# Patient Record
Sex: Male | Born: 2000 | Race: Black or African American | Hispanic: No | Marital: Single | Smoking: Never smoker
Health system: Southern US, Community
[De-identification: ages and names within clinical notes are randomized; demographics above are authoritative.]

## PROBLEM LIST (undated history)

## (undated) DIAGNOSIS — D571 Sickle-cell disease without crisis: Secondary | ICD-10-CM

## (undated) DIAGNOSIS — J302 Other seasonal allergic rhinitis: Secondary | ICD-10-CM

## (undated) DIAGNOSIS — D5701 Hb-SS disease with acute chest syndrome: Secondary | ICD-10-CM

## (undated) HISTORY — PX: OTHER SURGICAL HISTORY: SHX169

## (undated) HISTORY — PX: TONSILLECTOMY AND ADENOIDECTOMY: SUR1326

## (undated) HISTORY — PX: CHOLECYSTECTOMY: SHX55

---
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
## 2018-11-16 ENCOUNTER — Emergency Department (HOSPITAL_COMMUNITY): Payer: Medicaid Other

## 2018-11-16 ENCOUNTER — Other Ambulatory Visit: Payer: Self-pay

## 2018-11-16 ENCOUNTER — Encounter (HOSPITAL_COMMUNITY): Payer: Self-pay

## 2018-11-16 ENCOUNTER — Inpatient Hospital Stay (HOSPITAL_COMMUNITY)
Admission: EM | Admit: 2018-11-16 | Discharge: 2018-11-20 | DRG: 193 | Disposition: A | Discharge status: Home / Self Care | Payer: Medicaid Other | Attending: Internal Medicine | Admitting: Internal Medicine

## 2018-11-16 DIAGNOSIS — D649 Anemia, unspecified: Secondary | ICD-10-CM | POA: Diagnosis not present

## 2018-11-16 DIAGNOSIS — R6510 Systemic inflammatory response syndrome (SIRS) of non-infectious origin without acute organ dysfunction: Secondary | ICD-10-CM | POA: Diagnosis present

## 2018-11-16 DIAGNOSIS — Z8249 Family history of ischemic heart disease and other diseases of the circulatory system: Secondary | ICD-10-CM

## 2018-11-16 DIAGNOSIS — D638 Anemia in other chronic diseases classified elsewhere: Secondary | ICD-10-CM | POA: Diagnosis present

## 2018-11-16 DIAGNOSIS — J9601 Acute respiratory failure with hypoxia: Secondary | ICD-10-CM | POA: Diagnosis present

## 2018-11-16 DIAGNOSIS — Z881 Allergy status to other antibiotic agents status: Secondary | ICD-10-CM

## 2018-11-16 DIAGNOSIS — D5701 Hb-SS disease with acute chest syndrome: Secondary | ICD-10-CM | POA: Diagnosis present

## 2018-11-16 DIAGNOSIS — Z79899 Other long term (current) drug therapy: Secondary | ICD-10-CM | POA: Diagnosis not present

## 2018-11-16 DIAGNOSIS — Z832 Family history of diseases of the blood and blood-forming organs and certain disorders involving the immune mechanism: Secondary | ICD-10-CM

## 2018-11-16 DIAGNOSIS — R509 Fever, unspecified: Secondary | ICD-10-CM

## 2018-11-16 DIAGNOSIS — J189 Pneumonia, unspecified organism: Principal | ICD-10-CM | POA: Diagnosis present

## 2018-11-16 DIAGNOSIS — Z833 Family history of diabetes mellitus: Secondary | ICD-10-CM | POA: Diagnosis not present

## 2018-11-16 DIAGNOSIS — D5700 Hb-SS disease with crisis, unspecified: Secondary | ICD-10-CM

## 2018-11-16 DIAGNOSIS — R05 Cough: Secondary | ICD-10-CM

## 2018-11-16 DIAGNOSIS — D571 Sickle-cell disease without crisis: Secondary | ICD-10-CM | POA: Diagnosis present

## 2018-11-16 DIAGNOSIS — Z823 Family history of stroke: Secondary | ICD-10-CM | POA: Diagnosis not present

## 2018-11-16 DIAGNOSIS — R5081 Fever presenting with conditions classified elsewhere: Secondary | ICD-10-CM | POA: Diagnosis present

## 2018-11-16 DIAGNOSIS — J181 Lobar pneumonia, unspecified organism: Secondary | ICD-10-CM | POA: Diagnosis not present

## 2018-11-16 DIAGNOSIS — R059 Cough, unspecified: Secondary | ICD-10-CM

## 2018-11-16 LAB — RETICULOCYTES
Immature Retic Fract: 34.2 % — ABNORMAL HIGH (ref 2.3–15.9)
RBC.: 2.2 MIL/uL — AB (ref 4.22–5.81)
RETIC CT PCT: 11.6 % — AB (ref 0.4–3.1)
Retic Count, Absolute: 334.2 10*3/uL — ABNORMAL HIGH (ref 19.0–186.0)

## 2018-11-16 LAB — CBC WITH DIFFERENTIAL/PLATELET
Abs Immature Granulocytes: 0.18 10*3/uL — ABNORMAL HIGH (ref 0.00–0.07)
Basophils Absolute: 0.1 10*3/uL (ref 0.0–0.1)
Basophils Relative: 0 %
EOS ABS: 0 10*3/uL (ref 0.0–0.5)
EOS PCT: 0 %
HEMATOCRIT: 19.1 % — AB (ref 39.0–52.0)
Hemoglobin: 6.6 g/dL — CL (ref 13.0–17.0)
Immature Granulocytes: 1 %
LYMPHS ABS: 3.4 10*3/uL (ref 0.7–4.0)
Lymphocytes Relative: 17 %
MCH: 30 pg (ref 26.0–34.0)
MCHC: 34.6 g/dL (ref 30.0–36.0)
MCV: 86.8 fL (ref 80.0–100.0)
Monocytes Absolute: 1.4 10*3/uL — ABNORMAL HIGH (ref 0.1–1.0)
Monocytes Relative: 7 %
Neutro Abs: 14.5 10*3/uL — ABNORMAL HIGH (ref 1.7–7.7)
Neutrophils Relative %: 75 %
Platelets: 435 10*3/uL — ABNORMAL HIGH (ref 150–400)
RBC: 2.2 MIL/uL — ABNORMAL LOW (ref 4.22–5.81)
RDW: 24.5 % — AB (ref 11.5–15.5)
WBC: 19.5 10*3/uL — ABNORMAL HIGH (ref 4.0–10.5)
nRBC: 0.6 % — ABNORMAL HIGH (ref 0.0–0.2)

## 2018-11-16 LAB — COMPREHENSIVE METABOLIC PANEL
ALT: 11 U/L (ref 0–44)
ANION GAP: 10 (ref 5–15)
AST: 34 U/L (ref 15–41)
Albumin: 3.9 g/dL (ref 3.5–5.0)
Alkaline Phosphatase: 42 U/L (ref 38–126)
BUN: 8 mg/dL (ref 6–20)
CHLORIDE: 103 mmol/L (ref 98–111)
CO2: 25 mmol/L (ref 22–32)
CREATININE: 0.55 mg/dL — AB (ref 0.61–1.24)
Calcium: 8.4 mg/dL — ABNORMAL LOW (ref 8.9–10.3)
Glucose, Bld: 93 mg/dL (ref 70–99)
Potassium: 3.2 mmol/L — ABNORMAL LOW (ref 3.5–5.1)
Sodium: 138 mmol/L (ref 135–145)
Total Bilirubin: 11.8 mg/dL — ABNORMAL HIGH (ref 0.3–1.2)
Total Protein: 7.9 g/dL (ref 6.5–8.1)

## 2018-11-16 LAB — LACTIC ACID, PLASMA: LACTIC ACID, VENOUS: 1 mmol/L (ref 0.5–1.9)

## 2018-11-16 LAB — PREPARE RBC (CROSSMATCH)

## 2018-11-16 LAB — ABO/RH: ABO/RH(D): A POS

## 2018-11-16 MED ORDER — FOLIC ACID 1 MG PO TABS
1.0000 mg | ORAL_TABLET | Freq: Every day | ORAL | Status: DC
  Administered 2018-11-16 – 2018-11-20 (×5): 1 mg via ORAL
  Filled 2018-11-16 (×5): qty 1

## 2018-11-16 MED ORDER — HYDROXYUREA 500 MG PO CAPS
1500.0000 mg | ORAL_CAPSULE | Freq: Every day | ORAL | Status: DC
  Administered 2018-11-16 – 2018-11-18 (×3): 1500 mg via ORAL
  Filled 2018-11-16 (×3): qty 3

## 2018-11-16 MED ORDER — SODIUM CHLORIDE 0.9 % IV SOLN
INTRAVENOUS | Status: DC | PRN
  Administered 2018-11-16: 500 mL via INTRAVENOUS

## 2018-11-16 MED ORDER — ACETAMINOPHEN 325 MG PO TABS
650.0000 mg | ORAL_TABLET | Freq: Once | ORAL | Status: AC | PRN
  Administered 2018-11-16: 650 mg via ORAL
  Filled 2018-11-16: qty 2

## 2018-11-16 MED ORDER — SODIUM CHLORIDE 0.9% IV SOLUTION
Freq: Once | INTRAVENOUS | Status: AC
  Administered 2018-11-16: 22:00:00 via INTRAVENOUS

## 2018-11-16 MED ORDER — IBUPROFEN 200 MG PO TABS
600.0000 mg | ORAL_TABLET | Freq: Three times a day (TID) | ORAL | Status: DC | PRN
  Administered 2018-11-17 (×2): 600 mg via ORAL
  Filled 2018-11-16 (×3): qty 3

## 2018-11-16 MED ORDER — OXYCODONE-ACETAMINOPHEN 5-325 MG PO TABS: 1.0000 | ORAL_TABLET | Freq: Three times a day (TID) | ORAL | Status: DC | PRN

## 2018-11-16 MED ORDER — SODIUM CHLORIDE 0.9 % IV BOLUS (SEPSIS)
1000.0000 mL | Freq: Once | INTRAVENOUS | Status: AC
  Administered 2018-11-16: 1000 mL via INTRAVENOUS

## 2018-11-16 MED ORDER — LISINOPRIL 2.5 MG PO TABS
2.5000 mg | ORAL_TABLET | Freq: Every day | ORAL | Status: DC
  Administered 2018-11-17 – 2018-11-20 (×3): 2.5 mg via ORAL
  Filled 2018-11-16 (×5): qty 1

## 2018-11-16 MED ORDER — SODIUM CHLORIDE 0.9 % IV SOLN
INTRAVENOUS | Status: DC
  Administered 2018-11-16 – 2018-11-17 (×2): via INTRAVENOUS

## 2018-11-16 MED ORDER — ACETAMINOPHEN 325 MG PO TABS
650.0000 mg | ORAL_TABLET | Freq: Four times a day (QID) | ORAL | Status: DC | PRN
  Administered 2018-11-16 – 2018-11-20 (×4): 650 mg via ORAL
  Filled 2018-11-16 (×5): qty 2

## 2018-11-16 MED ORDER — OXYCODONE-ACETAMINOPHEN 10-325 MG PO TABS: 1.0000 | ORAL_TABLET | Freq: Three times a day (TID) | ORAL | Status: DC | PRN

## 2018-11-16 MED ORDER — CEFTRIAXONE SODIUM 1 G IJ SOLR
1.0000 g | Freq: Once | INTRAMUSCULAR | Status: AC
  Administered 2018-11-16: 1 g via INTRAVENOUS
  Filled 2018-11-16: qty 10

## 2018-11-16 MED ORDER — DOXYCYCLINE HYCLATE 100 MG PO TABS
100.0000 mg | ORAL_TABLET | Freq: Once | ORAL | Status: AC
  Administered 2018-11-16: 100 mg via ORAL
  Filled 2018-11-16: qty 1

## 2018-11-16 MED ORDER — SODIUM CHLORIDE 0.9 % IV SOLN
1.0000 g | INTRAVENOUS | Status: DC
  Administered 2018-11-17 – 2018-11-19 (×3): 1 g via INTRAVENOUS
  Filled 2018-11-16 (×3): qty 1

## 2018-11-16 MED ORDER — DOXYCYCLINE HYCLATE 100 MG PO TABS
100.0000 mg | ORAL_TABLET | Freq: Two times a day (BID) | ORAL | Status: DC
  Administered 2018-11-17 – 2018-11-20 (×7): 100 mg via ORAL
  Filled 2018-11-16 (×7): qty 1

## 2018-11-16 MED ORDER — HYDROXYUREA 500 MG PO CAPS: 1000.0000 mg | ORAL_CAPSULE | Freq: Every day | ORAL | Status: DC

## 2018-11-16 MED ORDER — OXYCODONE HCL 5 MG PO TABS: 5.0000 mg | ORAL_TABLET | Freq: Three times a day (TID) | ORAL | Status: DC | PRN

## 2018-11-16 NOTE — ED Notes (Signed)
ED TO INPATIENT HANDOFF REPORT  Name/Age/Gender William Gallegos 18 y.o. male  Code Status    Code Status Orders  (From admission, onward)         Start     Ordered   11/16/18 1929  Full code  Continuous     11/16/18 1929        Code Status History    Date Active Date Inactive Code Status Order ID Comments User Context   11/11/2017 2341 11/13/2017 1736 Full Code [id number]  Rometta EmeryGarba, Mohammad L, MD ED   05/09/2017 0026 05/10/2017 1442 Full Code [id number]  Briscoe Deutscherpyd, Timothy S, MD ED   06/13/2015 0527 06/15/2015 2121 Full Code [id number]  Ovid Curdkonye, Jeffrey, MD Inpatient      Home/SNF/Other Home  Chief Complaint possible pneumonia  Level of Care/Admitting Diagnosis ED Disposition    ED Disposition Condition Comment   Admit  [facility]ospital [facility] [[id number]]  Level of Care: Telemetry [5]  Admit to tele based on following criteria: Complex arrhythmia (Bradycardia/Tachycardia)  Diagnosis: PNA (pneumonia) [[id number]][[id number]]  Admitting Physician: Haydee MonicaAVID, RACHAL A [[id number]]  Attending Physician: Tarry KosAVID, RACHAL A [[id number]]  Estimated length of stay: 3 - 4 days  Certification:: I certify this patient will need inpatient services for at least 2 midnights  PT Class (Do Not Modify): Inpatient [101]  PT Acc Code (Do Not Modify): Private [1]       Medical History Past Medical History:  Diagnosis Date  . Acute chest syndrome (HCC)   . Seasonal allergies   . Sickle cell anemia (HCC)     Allergies Allergies  Allergen Reactions  . 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 Peanuts [Peanut Oil] Anaphylaxis and Swelling    Swelling of face   . Erythromycin Hives  . Other Swelling    Raisins--- facial swelling Mushrooms---face swelling    IV Location/Drains/Wounds Patient Lines/Drains/Airways Status   Active Line/Drains/Airways    Name:   Placement date:   Placement time:   Site:   Days:   Peripheral IV 11/16/18 Right;Anterior Forearm   11/16/18     1722    Forearm   less than 1          Labs/Imaging Results for orders placed or performed during the hospital encounter of 11/16/18 (from the past 48 hour(s))  Comprehensive metabolic panel     Status: Abnormal   Collection Time: 11/16/18  5:14 PM  Result Value Ref Range   Sodium 138 135 - 145 mmol/L   Potassium 3.2 (L) 3.5 - 5.1 mmol/L   Chloride 103 98 - 111 mmol/L   CO2 25 22 - 32 mmol/L   Glucose, Bld 93 70 - 99 mg/dL   BUN 8 6 - 20 mg/dL   Creatinine, Ser 4.690.55 (L) 0.61 - 1.24 mg/dL   Calcium 8.4 (L) 8.9 - 10.3 mg/dL   Total Protein 7.9 6.5 - 8.1 g/dL   Albumin 3.9 3.5 - 5.0 g/dL   AST 34 15 - 41 U/L   ALT 11 0 - 44 U/L   Alkaline Phosphatase 42 38 - 126 U/L   Total Bilirubin 11.8 (H) 0.3 - 1.2 mg/dL   GFR calc non Af Amer >60 >60 mL/min   GFR calc Af Amer >60 >60 mL/min   Anion gap 10 5 - 15    Comment: Performed at [facility], [address] [place], [place] [address]  CBC with Differential  Status: Abnormal   Collection Time: 11/16/18  5:14 PM  Result Value Ref Range   WBC 19.5 (H) 4.0 - 10.5 K/uL   RBC 2.20 (L) 4.22 - 5.81 MIL/uL   Hemoglobin 6.6 (LL) 13.0 - 17.0 g/dL    Comment: This critical result has verified and been called to [facility] by Zetta Bills on 02 15 2020 at 1833, and has been read back. CRITICAL VERIFIED   HCT 19.1 (L) 39.0 - 52.0 %   MCV 86.8 80.0 - 100.0 fL   MCH 30.0 26.0 - 34.0 pg   MCHC 34.6 30.0 - 36.0 g/dL   RDW 41.9 (H) 62.2 - 29.7 %   Platelets 435 (H) 150 - 400 K/uL   nRBC 0.6 (H) 0.0 - 0.2 %   Neutrophils Relative % 75 %   Neutro Abs 14.5 (H) 1.7 - 7.7 K/uL   Lymphocytes Relative 17 %   Lymphs Abs 3.4 0.7 - 4.0 K/uL   Monocytes Relative 7 %   Monocytes Absolute 1.4 (H) 0.1 - 1.0 K/uL   Eosinophils Relative 0 %   Eosinophils Absolute 0.0 0.0 - 0.5 K/uL   Basophils Relative 0 %   Basophils Absolute 0.1 0.0 - 0.1 K/uL   RBC Morphology RARE NUCLEATED RBC NOTED    Immature Granulocytes 1 %   Abs Immature  Granulocytes 0.18 (H) 0.00 - 0.07 K/uL   Polychromasia PRESENT    Sickle Cells PRESENT    Target Cells PRESENT     Comment: Performed at [facility], [address] [place], [place] [address]  Lactic acid, plasma     Status: None   Collection Time: 11/16/18  5:14 PM  Result Value Ref Range   Lactic Acid, Venous 1.0 0.5 - 1.9 mmol/L    Comment: Performed at [facility], [address], [place], [place] [address]  Reticulocytes     Status: Abnormal   Collection Time: 11/16/18  5:14 PM  Result Value Ref Range   Retic Ct Pct 11.6 (H) 0.4 - 3.1 %    Comment: MANUAL DILUTION   RBC. 2.20 (L) 4.22 - 5.81 MIL/uL   Retic Count, Absolute 334.2 (H) 19.0 - 186.0 K/uL   Immature Retic Fract 34.2 (H) 2.3 - 15.9 %    Comment: Performed at [facility], [address] [place], [place] [address]   Dg Chest 2 View  Result Date: 11/16/2018 CLINICAL DATA:  Nonproductive cough for 5 days, hard to take a deep breath, 86% oxygen saturation on room air, history sickle cell anemia EXAM: CHEST - 2 VIEW COMPARISON:  None FINDINGS: Enlargement of cardiac silhouette with pulmonary vascular congestion consistent with sickle cell disease. Mediastinal contours normal. LEFT lower lobe consolidation consistent with pneumonia. Remaining lungs clear. No pleural effusion or pneumothorax. IMPRESSION: Enlargement of cardiac silhouette with pulmonary vascular congestion consistent with history of sickle cell disease. LEFT lower lobe pneumonia. Electronically Signed   By: Ulyses Southward M.D.   On: 11/16/2018 16:39   None  Pending Labs Unresulted Labs (From admission, onward)    Start     Ordered   11/17/18 0500  Basic metabolic panel  Tomorrow morning,   R     11/16/18 1929   11/17/18 0500  CBC WITH DIFFERENTIAL  Tomorrow morning,   R     11/16/18 1929   11/16/18 1928  Culture, blood (routine x 2) Call MD if unable to obtain prior to antibiotics being given  BLOOD  CULTURE  X 2,   R    Comments:  If blood cultures drawn in Emergency Department - Do not draw and cancel order    11/16/18 1929   11/16/18 1928  Culture, sputum-assessment  Once,   R     11/16/18 1929   11/16/18 1928  Gram stain  Once,   R     11/16/18 1929   11/16/18 1928  Strep pneumoniae urinary antigen  Once,   R     11/16/18 1929   11/16/18 1852  Type and screen [facility]  Once,   R    Comments:  [facility]    11/16/18 1855   11/16/18 1852  Prepare RBC  (Adult Blood Administration - Red Blood Cells)  Once,   R    Question Answer Comment  # of Units 1 unit   Transfusion Indications Sickle cell crisis   Special Requirements Sickle Cell Protocol   If emergent release call blood bank Not emergent release   Instructions: Transfuse      11/16/18 1855   11/16/18 1633  Urinalysis, Routine w reflex microscopic  ONCE - STAT,   STAT     11/16/18 1632   11/16/18 1633  Urine culture  ONCE - STAT,   STAT     11/16/18 1632   11/16/18 1632  Culture, blood (routine x 2)  BLOOD CULTURE X 2,   STAT     11/16/18 1631   11/16/18 1632  Respiratory Panel by PCR  (Respiratory virus panel with precautions)  Once,   R     11/16/18 1631          Vitals/Pain Today's Vitals   11/16/18 1830 11/16/18 1900 11/16/18 1950 11/16/18 1950  BP: 117/69 111/63  110/66  Pulse: 83 88  90  Resp: (!) 22 (!) 25  (!) 26  Temp:    99.3 F (37.4 C)  TempSrc:    Oral  SpO2: 99% 99%  100%  Weight:      Height:      PainSc:   0-No pain     Isolation Precautions Droplet precaution  Medications Medications  0.9 %  sodium chloride infusion (Manually program via Guardrails IV Fluids) (has no administration in time range)  hydroxyurea (HYDREA) capsule 1,000 mg (has no administration in time range)  folic acid (FOLVITE) tablet 1 mg (has no administration in time range)  ibuprofen (ADVIL,MOTRIN) tablet 600 mg (has no administration in time range)  lisinopril  (PRINIVIL,ZESTRIL) tablet 2.5 mg (has no administration in time range)  oxyCODONE-acetaminophen (PERCOCET) 10-325 MG per tablet 1 tablet (has no administration in time range)  0.9 %  sodium chloride infusion (has no administration in time range)  cefTRIAXone (ROCEPHIN) 1 g in sodium chloride 0.9 % 100 mL IVPB (has no administration in time range)  doxycycline (VIBRA-TABS) tablet 100 mg (has no administration in time range)  acetaminophen (TYLENOL) tablet 650 mg (has no administration in time range)  acetaminophen (TYLENOL) tablet 650 mg (650 mg Oral Given 11/16/18 1605)  cefTRIAXone (ROCEPHIN) 1 g in sodium chloride 0.9 % 100 mL IVPB (0 g Intravenous Stopped 11/16/18 1853)  doxycycline (VIBRA-TABS) tablet 100 mg (100 mg Oral Given 11/16/18 1730)  sodium chloride 0.9 % bolus 1,000 mL (0 mLs Intravenous Stopped 11/16/18 1854)    Mobility walks

## 2018-11-16 NOTE — H&P (Signed)
History and Physical    William SpottedKevarian Sargent RUE:[id number] DOB: 06/20/2001 DOA: 11/16/2018  PCP: Bing NeighborsHarris, Kimberly S, FNP  Patient coming from: Home  Chief Complaint: Fever cough  HPI: William SpottedKevarian Gallegos is a 18 y.o. male with medical history significant of sickle cell disease comes in with 5 days of upper respiratory tract infection that started off with nasal congestion and cough.  Approximately 2 days ago he started running high fevers.  He denies any sick contacts.  Cough is been productive.  He denies any nausea vomiting or diarrhea.  He denies any urinary symptoms.  He denies any unusual pain that is been exacerbated by the illness.  He denies any rashes.  He denies being on any recent antibiotics and well over 3 months.  Patient is being referred for admission for the finding of pneumonia in the setting of sickle cell anemia/crisis.  On arrival his oxygen sats were 86% on room air this is normalized on 2-1/2 L of oxygen.  He normally does not have any supplemental oxygen requirements at home chronically.  Review of Systems: As per HPI otherwise 10 point review of systems negative.   Past Medical History:  Diagnosis Date  . Acute chest syndrome (HCC)   . Seasonal allergies   . Sickle cell anemia (HCC)     Past Surgical History:  Procedure Laterality Date  . adnoidectomy    . CHOLECYSTECTOMY    . TONSILLECTOMY AND ADENOIDECTOMY       reports that he has never smoked. He has never used smokeless tobacco. He reports that he does not drink alcohol or use drugs.  Allergies  Allergen Reactions  . 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 Peanuts [Peanut Oil] Anaphylaxis and Swelling    Swelling of face   . Erythromycin Hives  . Other Swelling    Raisins--- facial swelling Mushrooms---face swelling    Family History  Problem Relation Age of Onset  . Hypertension Other   . Diabetes Other   . Cancer Other   . Stroke Other   . Sickle cell anemia Brother    . Sickle cell anemia Maternal Grandfather     Prior to Admission medications   Medication Sig Start Date End Date Taking? Authorizing Provider  folic acid (FOLVITE) 1 MG tablet Take 1 mg by mouth daily.  06/03/14  Yes [provider]  hydroxyurea (HYDREA) 500 MG capsule TAKE 3 CAPSULES (1,500 MG TOTAL) BY MOUTH DAILY. 10/06/14  Yes [provider]  ibuprofen (ADVIL,MOTRIN) 600 MG tablet Take 600 mg by mouth 3 (three) times daily as needed for mild pain.  06/10/15  Yes [provider]  lisinopril (PRINIVIL,ZESTRIL) 2.5 MG tablet Take 2.5 mg by mouth daily. 12/23/16  Yes [provider]  oxyCODONE-acetaminophen (PERCOCET) 10-325 MG tablet Take 1 tablet by mouth every 8 (eight) hours as needed for pain. 11/08/17  Yes Derwood KaplanNanavati, Ankit, MD  amoxicillin (AMOXIL) 500 MG capsule Take 1 capsule (500 mg total) by mouth 3 (three) times daily. Patient not taking: Reported on 11/16/2018 11/08/17   Derwood KaplanNanavati, Ankit, MD  naproxen (NAPROSYN) 500 MG tablet Take 1 tablet (500 mg total) by mouth 2 (two) times daily with a meal. Patient not taking: Reported on 11/11/2017 11/08/17   Derwood KaplanNanavati, Ankit, MD    Physical Exam: Vitals:   11/16/18 1725 11/16/18 1730 11/16/18 1830 11/16/18 1900  BP:  117/62 117/69 111/63  Pulse:  88 83 88  Resp:  (!) 25 (!)  22 (!) 25  Temp:      TempSrc:      SpO2:  98% 99% 99%  Weight: 70.3 kg     Height: 5\' 8"  (1.727 m)         Constitutional: NAD, calm, comfortable Vitals:   11/16/18 1725 11/16/18 1730 11/16/18 1830 11/16/18 1900  BP:  117/62 117/69 111/63  Pulse:  88 83 88  Resp:  (!) 25 (!) 22 (!) 25  Temp:      TempSrc:      SpO2:  98% 99% 99%  Weight: 70.3 kg     Height: 5\' 8"  (1.727 m)      Eyes: PERRL, lids and conjunctivae normal ENMT: Mucous membranes are moist. Posterior pharynx clear of any exudate or lesions.Normal dentition.  Neck: normal, supple, no masses, no thyromegaly Respiratory: clear to auscultation bilaterally, no  wheezing, no crackles. Normal respiratory effort. No accessory muscle use.  Cardiovascular: Regular rate and rhythm, no murmurs / rubs / gallops. No extremity edema. 2+ pedal pulses. No carotid bruits.  Abdomen: no tenderness, no masses palpated. No hepatosplenomegaly. Bowel sounds positive.  Musculoskeletal: no clubbing / cyanosis. No joint deformity upper and lower extremities. Good ROM, no contractures. Normal muscle tone.  Skin: no rashes, lesions, ulcers. No induration Neurologic: CN 2-12 grossly intact. Sensation intact, DTR normal. Strength 5/5 in all 4.  Psychiatric: Normal judgment and insight. Alert and oriented x 3. Normal mood.    Labs on Admission: I have personally reviewed following labs and imaging studies  CBC: Recent Labs  Lab 11/16/18 1714  WBC 19.5*  NEUTROABS 14.5*  HGB 6.6*  HCT 19.1*  MCV 86.8  PLT 435*   Basic Metabolic Panel: Recent Labs  Lab 11/16/18 1714  NA 138  K 3.2*  CL 103  CO2 25  GLUCOSE 93  BUN 8  CREATININE 0.55*  CALCIUM 8.4*   GFR: Estimated Creatinine Clearance: 142.5 mL/min (A) (by C-G formula based on SCr of 0.55 mg/dL (L)). Liver Function Tests: Recent Labs  Lab 11/16/18 1714  AST 34  ALT 11  ALKPHOS 42  BILITOT 11.8*  PROT 7.9  ALBUMIN 3.9   No results for input(s): LIPASE, AMYLASE in the last 168 hours. No results for input(s): AMMONIA in the last 168 hours. Coagulation Profile: No results for input(s): INR, PROTIME in the last 168 hours. Cardiac Enzymes: No results for input(s): CKTOTAL, CKMB, CKMBINDEX, TROPONINI in the last 168 hours. BNP (last 3 results) No results for input(s): PROBNP in the last 8760 hours. HbA1C: No results for input(s): HGBA1C in the last 72 hours. CBG: No results for input(s): GLUCAP in the last 168 hours. Lipid Profile: No results for input(s): CHOL, HDL, LDLCALC, TRIG, CHOLHDL, LDLDIRECT in the last 72 hours. Thyroid Function Tests: No results for input(s): TSH, T4TOTAL, FREET4,  T3FREE, THYROIDAB in the last 72 hours. Anemia Panel: Recent Labs    11/16/18 1714  RETICCTPCT 11.6*   Urine analysis:    Component Value Date/Time   COLORURINE AMBER (A) 11/11/2017 2142   APPEARANCEUR CLEAR 11/11/2017 2142   LABSPEC 1.032 (H) 11/11/2017 2142   PHURINE 6.0 11/11/2017 2142   GLUCOSEU NEGATIVE 11/11/2017 2142   HGBUR MODERATE (A) 11/11/2017 2142   BILIRUBINUR NEGATIVE 11/11/2017 2142   KETONESUR NEGATIVE 11/11/2017 2142   PROTEINUR 100 (A) 11/11/2017 2142   UROBILINOGEN 1.0 06/12/2015 2243   NITRITE NEGATIVE 11/11/2017 2142   LEUKOCYTESUR NEGATIVE 11/11/2017 2142   Sepsis Labs: !!!!!!!!!!!!!!!!!!!!!!!!!!!!!!!!!!!!!!!!!!!! @LABRCNTIP (procalcitonin:4,lacticidven:4) )No results found for this  or any previous visit (from the past 240 hour(s)).   Radiological Exams on Admission: Dg Chest 2 View  Result Date: 11/16/2018 CLINICAL DATA:  Nonproductive cough for 5 days, hard to take a deep breath, 86% oxygen saturation on room air, history sickle cell anemia EXAM: CHEST - 2 VIEW COMPARISON:  None FINDINGS: Enlargement of cardiac silhouette with pulmonary vascular congestion consistent with sickle cell disease. Mediastinal contours normal. LEFT lower lobe consolidation consistent with pneumonia. Remaining lungs clear. No pleural effusion or pneumothorax. IMPRESSION: Enlargement of cardiac silhouette with pulmonary vascular congestion consistent with history of sickle cell disease. LEFT lower lobe pneumonia. Electronically Signed   By: Ulyses Southward M.D.   On: 11/16/2018 16:39   Old chart reviewed Discussed with EDP PA Chest x-ray reviewed left lower lobe infiltrate  Assessment/Plan 18 year old male with acute respiratory hypoxic failure secondary to pneumonia with a history of sickle cell anemia  Principal Problem:   PNA (pneumonia)-with acute hypoxic respiratory failure and Sirs.-Continue supplemental oxygen as needed.  Provide IV Rocephin and doxycycline.  Obtain blood  and sputum cultures.  Lactic acid level normal.  Respiratory viral panel is pending symptoms started 5 days ago.  Patient's lungs are clear no wheezing.  Hold off on any steroids at this time.  Admit to telemetry monitoring.  Not toxic appearing.  Active Problems:   Sickle cell anemia (HCC)-patient hemoglobin baseline is above 8 is dropped down to 6.6.  Transfusing 1 unit of blood.  Treat infection.    SIRS (systemic inflammatory response syndrome) (HCC)-treat infection as above.  Tachycardia below 110.  Blood pressure fine.  Hypoxia improved with supplemental oxygen.  Lactic acid level normal.  Source of infection lung.     DVT prophylaxis: SCDs and ambulate Code Status: Full Family Communication: None Disposition Plan: Days Consults called: None Admission status: Admission   Tkeyah Burkman A MD [facility]  If 7PM-7AM, please contact night-coverage [email or web address] Password [facility]  11/16/2018, 7:25 PM

## 2018-11-16 NOTE — ED Triage Notes (Signed)
Patient c/o a non productive cough x 5 days. Patient states "hard to take a deep breath."

## 2018-11-16 NOTE — ED Provider Notes (Signed)
[facility]-EMERGENCY DEPT Provider Note   CSN: [id number] Arrival date & time: 11/16/18  1547     History   Chief Complaint Chief Complaint  Patient presents with  . Cough  . Fever    HPI William Gallegos is a 18 y.o. male with a past medical history of sickle cell anemia, who presents today for evaluation of nonproductive cough and shortness of breath for about 5 days.  He states that it got worse yesterday.  He says he feels like he has a hard time taking a deep breath.  He denies any chest pain.  No nausea vomiting or diarrhea.  No back or abdominal pain.  No meds tried prior to arrival for fever.  He does not normally require any oxygen.  HPI  Past Medical History:  Diagnosis Date  . Acute chest syndrome (HCC)   . Seasonal allergies   . Sickle cell anemia [facility])     Patient Active Problem List   Diagnosis Date Noted  . PNA (pneumonia) 11/16/2018  . Other proteinuria   . Leucocytosis 11/11/2017  . Iron overload due to repeated red blood cell transfusions 01/21/2017  . Hx of transfusion of packed red blood cells 01/21/2017  . Sickle cell anemia (HCC) 06/13/2015    Past Surgical History:  Procedure Laterality Date  . adnoidectomy    . CHOLECYSTECTOMY    . TONSILLECTOMY AND ADENOIDECTOMY          Home Medications    Prior to Admission medications   Medication Sig Start Date End Date Taking? Authorizing Provider  folic acid (FOLVITE) 1 MG tablet Take 1 mg by mouth daily.  06/03/14  Yes [provider]  hydroxyurea (HYDREA) 500 MG capsule TAKE 3 CAPSULES (1,500 MG TOTAL) BY MOUTH DAILY. 10/06/14  Yes [provider]  ibuprofen (ADVIL,MOTRIN) 600 MG tablet Take 600 mg by mouth 3 (three) times daily as needed for mild pain.  06/10/15  Yes [provider]  lisinopril (PRINIVIL,ZESTRIL) 2.5 MG tablet Take 2.5 mg by mouth daily. 12/23/16  Yes [provider]  oxyCODONE-acetaminophen (PERCOCET) 10-325 MG tablet Take 1  tablet by mouth every 8 (eight) hours as needed for pain. 11/08/17  Yes Derwood Kaplan, MD  amoxicillin (AMOXIL) 500 MG capsule Take 1 capsule (500 mg total) by mouth 3 (three) times daily. Patient not taking: Reported on 11/16/2018 11/08/17   Derwood Kaplan, MD  naproxen (NAPROSYN) 500 MG tablet Take 1 tablet (500 mg total) by mouth 2 (two) times daily with a meal. Patient not taking: Reported on 11/11/2017 11/08/17   Derwood Kaplan, MD    Family History Family History  Problem Relation Age of Onset  . Hypertension Other   . Diabetes Other   . Cancer Other   . Stroke Other   . Sickle cell anemia Brother   . Sickle cell anemia Maternal Grandfather     Social History Social History   Tobacco Use  . Smoking status: Never Smoker  . Smokeless tobacco: Never Used  Substance Use Topics  . Alcohol use: No  . Drug use: No     Allergies   Carrot [daucus carota]; Peanuts [peanut oil]; Erythromycin; and Other   Review of Systems Review of Systems  Constitutional: Positive for chills, fatigue and fever.  HENT: Negative for congestion.   Respiratory: Positive for cough and shortness of breath. Negative for apnea.   Cardiovascular: Negative for chest pain, palpitations and leg swelling.  Gastrointestinal: Negative for abdominal pain, diarrhea, nausea  and vomiting.  Genitourinary: Negative for dysuria.  Musculoskeletal: Negative for arthralgias, back pain, myalgias and neck pain.  Skin: Negative for rash.  Neurological: Negative for weakness and headaches.  All other systems reviewed and are negative.    Physical Exam Updated Vital Signs BP 117/69   Pulse 83   Temp (!) 102.7 F (39.3 C) (Oral)   Resp (!) 22   Ht 5\' 8"  (1.727 m)   Wt 70.3 kg   SpO2 99%   BMI 23.57 kg/m   Physical Exam Vitals signs and nursing note reviewed.  Constitutional:      General: He is not in acute distress.    Appearance: He is well-developed and normal weight.  HENT:     Head: Normocephalic  and atraumatic.     Mouth/Throat:     Mouth: Mucous membranes are moist.  Eyes:     Conjunctiva/sclera: Conjunctivae normal.  Neck:     Musculoskeletal: Normal range of motion and neck supple. No neck rigidity.  Cardiovascular:     Rate and Rhythm: Regular rhythm. Tachycardia present.     Pulses: Normal pulses.     Heart sounds: Murmur present.  Pulmonary:     Effort: Pulmonary effort is normal. No respiratory distress.     Breath sounds: Normal breath sounds.  Abdominal:     General: Abdomen is flat. There is no distension.     Palpations: Abdomen is soft.     Tenderness: There is no abdominal tenderness.     Hernia: No hernia is present.  Musculoskeletal: Normal range of motion.        General: No tenderness.     Right lower leg: No edema.     Left lower leg: No edema.  Lymphadenopathy:     Cervical: No cervical adenopathy.  Skin:    General: Skin is warm and dry.  Neurological:     General: No focal deficit present.     Mental Status: He is alert and oriented to person, place, and time.     Cranial Nerves: No cranial nerve deficit.  Psychiatric:        Mood and Affect: Mood normal.        Behavior: Behavior normal.      ED Treatments / Results  Labs (all labs ordered are listed, but only abnormal results are displayed) Labs Reviewed  COMPREHENSIVE METABOLIC PANEL - Abnormal; Notable for the following components:      Result Value   Potassium 3.2 (*)    Creatinine, Ser 0.55 (*)    Calcium 8.4 (*)    Total Bilirubin 11.8 (*)    All other components within normal limits  CBC WITH DIFFERENTIAL/PLATELET - Abnormal; Notable for the following components:   WBC 19.5 (*)    RBC 2.20 (*)    Hemoglobin 6.6 (*)    HCT 19.1 (*)    RDW 24.5 (*)    Platelets 435 (*)    nRBC 0.6 (*)    Neutro Abs 14.5 (*)    Monocytes Absolute 1.4 (*)    Abs Immature Granulocytes 0.18 (*)    All other components within normal limits  RETICULOCYTES - Abnormal; Notable for the following  components:   Retic Ct Pct 11.6 (*)    RBC. 2.20 (*)    Retic Count, Absolute 334.2 (*)    Immature Retic Fract 34.2 (*)    All other components within normal limits  CULTURE, BLOOD (ROUTINE X 2)  CULTURE, BLOOD (ROUTINE X 2)  RESPIRATORY PANEL BY PCR  URINE CULTURE  LACTIC ACID, PLASMA  URINALYSIS, ROUTINE W REFLEX MICROSCOPIC  TYPE AND SCREEN  PREPARE RBC (CROSSMATCH)    EKG None  Radiology Dg Chest 2 View  Result Date: 11/16/2018 CLINICAL DATA:  Nonproductive cough for 5 days, hard to take a deep breath, 86% oxygen saturation on room air, history sickle cell anemia EXAM: CHEST - 2 VIEW COMPARISON:  None FINDINGS: Enlargement of cardiac silhouette with pulmonary vascular congestion consistent with sickle cell disease. Mediastinal contours normal. LEFT lower lobe consolidation consistent with pneumonia. Remaining lungs clear. No pleural effusion or pneumothorax. IMPRESSION: Enlargement of cardiac silhouette with pulmonary vascular congestion consistent with history of sickle cell disease. LEFT lower lobe pneumonia. Electronically Signed   By: Ulyses Southward M.D.   On: 11/16/2018 16:39    Procedures .Critical Care Performed by: Cristina Gong, PA-C Authorized by: Cristina Gong, PA-C   Critical care provider statement:    Critical care time (minutes):  45   Critical care time was exclusive of:  Separately billable procedures and treating other patients and teaching time   Critical care was necessary to treat or prevent imminent or life-threatening deterioration of the following conditions:  Respiratory failure, sepsis and circulatory failure   Critical care was time spent personally by me on the following activities:  Discussions with consultants, evaluation of patient's response to treatment, examination of patient, ordering and performing treatments and interventions, ordering and review of laboratory studies, ordering and review of radiographic studies, pulse oximetry,  re-evaluation of patient's condition, obtaining history from patient or surrogate and review of old charts   (including critical care time)  Medications Ordered in ED Medications  0.9 %  sodium chloride infusion ( Intravenous Stopped 11/16/18 1854)  0.9 %  sodium chloride infusion (Manually program via Guardrails IV Fluids) (has no administration in time range)  acetaminophen (TYLENOL) tablet 650 mg (650 mg Oral Given 11/16/18 1605)  cefTRIAXone (ROCEPHIN) 1 g in sodium chloride 0.9 % 100 mL IVPB (0 g Intravenous Stopped 11/16/18 1853)  doxycycline (VIBRA-TABS) tablet 100 mg (100 mg Oral Given 11/16/18 1730)  sodium chloride 0.9 % bolus 1,000 mL (0 mLs Intravenous Stopped 11/16/18 1854)     Initial Impression / Assessment and Plan / ED Course  I have reviewed the triage vital signs and the nursing notes.  Pertinent labs & imaging results that were available during my care of the patient were reviewed by me and considered in my medical decision making (see chart for details).  Clinical Course as of Nov 16 1906  Sat Nov 16, 2018  1905 Spoke with Dr. Onalee Hua who will see patient for admission.   [EH]    Clinical Course User Index [EH] Cristina Gong, PA-C   Patient presents today for evaluation of cough and shortness of breath.  This is been going on for 5 days however got much worse yesterday.  On arrival he is tachycardic with a heart rate of 111, febrile up to 102.7.  He is newly hypoxic with a SPO2 of 86% on room air with a good waveform.  He is placed on nasal cannula oxygen after which this improved.  Chest x-ray shows a left lower lobe consolidation consistent with pneumonia.  Chart review, including records through care everywhere show that normally his hemoglobin runs around 9, however today it is 6.6.  In the setting of sickle cell anemia with concern for acute chest 1 unit of sickle cell protocol blood was ordered.  His white count is significantly elevated at 19.5, consistent with  his pneumonia.  Reticulocyte is significantly elevated.  Total bilirubin is markedly elevated at 11.8.  He was given 1 liter IVF and tylenol after which his HR reduced into the 80s.  He was not given full 30 mL/kilo fluid bolus as he was not hypotensive and lactic acid was 1.    RVP was sent, has not resulted yet.    He was started on rocephin IV and PO doxycycline given allergy to erythromycin, did not want to give azithromycin.  Blood cultures were obtained. Sepsis re-evaluation was performed after one hour with improvement in condition (stabilization of HR).  I spoke with Dr. Onalee Huaavid who agreed to admit patient.    This patient was discussed with Dr. Lynelle DoctorKnapp.    Final Clinical Impressions(s) / ED Diagnoses   Final diagnoses:  Acute chest syndrome in sickle crisis (HCC)  Cough  Anemia, unspecified type  Fever in other diseases    ED Discharge Orders    None       Norman ClayHammond, Elle Vezina W, PA-C 11/16/18 1914    Linwood DibblesKnapp, Jon, MD 11/17/18 1517

## 2018-11-16 NOTE — ED Notes (Signed)
CRITICAL VALUE STICKER  CRITICAL VALUE: Hemoglobin 6.6  RECEIVER (on-site recipient of call): Lorelee New  DATE & TIME NOTIFIED: 11/16/18 1802  MESSENGER (representative from lab): Zetta Bills  MD NOTIFIED:   TIME OF NOTIFICATION:  RESPONSE:

## 2018-11-17 DIAGNOSIS — J181 Lobar pneumonia, unspecified organism: Secondary | ICD-10-CM

## 2018-11-17 LAB — BASIC METABOLIC PANEL
Anion gap: 8 (ref 5–15)
BUN: 9 mg/dL (ref 6–20)
CHLORIDE: 105 mmol/L (ref 98–111)
CO2: 25 mmol/L (ref 22–32)
Calcium: 7.6 mg/dL — ABNORMAL LOW (ref 8.9–10.3)
Creatinine, Ser: 0.6 mg/dL — ABNORMAL LOW (ref 0.61–1.24)
GFR calc Af Amer: >60 mL/min (ref >60)
GFR calc non Af Amer: >60 mL/min (ref >60)
Glucose, Bld: 121 mg/dL — ABNORMAL HIGH (ref 70–99)
Potassium: 3.3 mmol/L — ABNORMAL LOW (ref 3.5–5.1)
SODIUM: 138 mmol/L (ref 135–145)

## 2018-11-17 LAB — CBC WITH DIFFERENTIAL/PLATELET
BASOS ABS: 0 10*3/uL (ref 0.0–0.1)
Band Neutrophils: 0 %
Basophils Relative: 0 %
Blasts: 0 %
Eosinophils Absolute: 0.2 10*3/uL (ref 0.0–0.5)
Eosinophils Relative: 1 %
HCT: 19.3 % — ABNORMAL LOW (ref 39.0–52.0)
Hemoglobin: 6.6 g/dL — CL (ref 13.0–17.0)
Lymphocytes Relative: 25 %
Lymphs Abs: 5.4 10*3/uL — ABNORMAL HIGH (ref 0.7–4.0)
MCH: 29.7 pg (ref 26.0–34.0)
MCHC: 34.2 g/dL (ref 30.0–36.0)
MCV: 86.9 fL (ref 80.0–100.0)
METAMYELOCYTES PCT: 0 %
MONOS PCT: 7 %
Monocytes Absolute: 1.5 10*3/uL — ABNORMAL HIGH (ref 0.1–1.0)
Myelocytes: 0 %
Neutro Abs: 14.3 10*3/uL — ABNORMAL HIGH (ref 1.7–7.7)
Neutrophils Relative %: 67 %
Other: 0 %
Platelets: 382 10*3/uL (ref 150–400)
Promyelocytes Relative: 0 %
RBC: 2.22 MIL/uL — ABNORMAL LOW (ref 4.22–5.81)
RDW: 23.8 % — AB (ref 11.5–15.5)
WBC: 21.4 10*3/uL — ABNORMAL HIGH (ref 4.0–10.5)
nRBC: 0 /100 WBC
nRBC: 0.5 % — ABNORMAL HIGH (ref 0.0–0.2)

## 2018-11-17 LAB — RESPIRATORY PANEL BY PCR
ADENOVIRUS-RVPPCR: NOT DETECTED
Bordetella pertussis: NOT DETECTED
CORONAVIRUS 229E-RVPPCR: NOT DETECTED
CORONAVIRUS NL63-RVPPCR: NOT DETECTED
CORONAVIRUS OC43-RVPPCR: NOT DETECTED
Chlamydophila pneumoniae: NOT DETECTED
Coronavirus HKU1: NOT DETECTED
Influenza A: NOT DETECTED
Influenza B: NOT DETECTED
MYCOPLASMA PNEUMONIAE-RVPPCR: NOT DETECTED
Metapneumovirus: NOT DETECTED
PARAINFLUENZA VIRUS 1-RVPPCR: NOT DETECTED
PARAINFLUENZA VIRUS 4-RVPPCR: NOT DETECTED
Parainfluenza Virus 2: NOT DETECTED
Parainfluenza Virus 3: NOT DETECTED
Respiratory Syncytial Virus: NOT DETECTED
Rhinovirus / Enterovirus: NOT DETECTED

## 2018-11-17 LAB — URINALYSIS, ROUTINE W REFLEX MICROSCOPIC
BACTERIA UA: NONE SEEN
Bilirubin Urine: NEGATIVE
Glucose, UA: NEGATIVE mg/dL
KETONES UR: NEGATIVE mg/dL
Leukocytes,Ua: NEGATIVE
Nitrite: NEGATIVE
PROTEIN: 100 mg/dL — AB
Specific Gravity, Urine: 1.011 (ref 1.005–1.030)
pH: 6 (ref 5.0–8.0)

## 2018-11-17 LAB — STREP PNEUMONIAE URINARY ANTIGEN: Strep Pneumo Urinary Antigen: NEGATIVE

## 2018-11-17 MED ORDER — ALBUTEROL SULFATE (2.5 MG/3ML) 0.083% IN NEBU
2.5000 mg | INHALATION_SOLUTION | Freq: Once | RESPIRATORY_TRACT | Status: AC
  Administered 2018-11-17: 2.5 mg via RESPIRATORY_TRACT
  Filled 2018-11-17: qty 3

## 2018-11-17 MED ORDER — DEXTROSE-NACL 5-0.45 % IV SOLN
INTRAVENOUS | Status: DC
  Administered 2018-11-17: 10:00:00 via INTRAVENOUS

## 2018-11-17 NOTE — Progress Notes (Addendum)
Attempt to wean from O2. He remains 90-92 % on RA. Continue on 1 L O2 [place]. Will monitor.  11/17/2018 1625 pt. C/o feeling more short of breath. States he feels like he isn't breathing well "on the left side". O2 sat 93% on 2L, resp 20/min. Chest CTA, no wheezing. Non productive cough. MD paged, orders received.

## 2018-11-17 NOTE — Progress Notes (Signed)
Patient temp was 102.4 after blood transfusion ended. N.P. Informed. Will continue to monitor.

## 2018-11-17 NOTE — Progress Notes (Signed)
CRITICAL VALUE ALERT  Critical Value:  hgb 6.6  Date & Time Notied:  0610 11/17/2018  Provider Notified: X.Blount  Orders Received/Actions taken: Waiting for orders.

## 2018-11-17 NOTE — Progress Notes (Signed)
Patient ID: William Gallegos, male   DOB: Jun 09, 2001, 18 y.o.   MRN: [id number] Subjective:  William Gallegos is a 18 y.o. male with medical history significant of sickle cell disease who was admitted yesterday for left lower lobe pneumonia and acute on chronic anemia.  Patient is currently status post transfusion of 1 unit of packed red blood cell, and on antibiotics.  Patient claims pain is now at 0/10. No shortness of breath. No chest pain. Fever is subsiding. He denies any headache, visual disturbance, no dysuria.  No nausea or vomiting.  No constipation or diarrhea.  Objective:  Vital signs in last 24 hours:  Vitals:   11/17/18 0045 11/17/18 0159 11/17/18 0252 11/17/18 0415  BP: (!) 113/53   (!) 112/54  Pulse: (!) 102   77  Resp: 20     Temp: (!) 102.3 F (39.1 C) (!) 101.4 F (38.6 C) 99.2 F (37.3 C) 98.7 F (37.1 C)  TempSrc: Oral Oral Oral Oral  SpO2: 92%   100%  Weight:      Height:        Intake/Output from previous day:   Intake/Output Summary (Last 24 hours) at 11/17/2018 1211 Last data filed at 11/17/2018 1100 Gross per 24 hour  Intake 3227.47 ml  Output 1800 ml  Net 1427.47 ml    Physical Exam: General: Alert, awake, oriented x3, in no acute distress.  HEENT: [place]/AT PEERL, EOMI Neck: Trachea midline,  no masses, no thyromegal,y no JVD, no carotid bruit OROPHARYNX:  Moist, No exudate/ erythema/lesions.  Heart: Regular rate and rhythm, without murmurs, rubs, gallops, PMI non-displaced, no heaves or thrills on palpation.  Lungs: Clear to auscultation, no wheezing or rhonchi noted. No increased vocal fremitus resonant to percussion  Abdomen: Soft, nontender, nondistended, positive bowel sounds, no masses no hepatosplenomegaly noted..  Neuro: No focal neurological deficits noted cranial nerves II through XII grossly intact. DTRs 2+ bilaterally upper and lower extremities. Strength 5 out of 5 in bilateral upper and lower extremities. Musculoskeletal: No warm swelling  or erythema around joints, no spinal tenderness noted. Psychiatric: Patient alert and oriented x3, good insight and cognition, good recent to remote recall. Lymph node survey: No cervical axillary or inguinal lymphadenopathy noted.  Lab Results:  Basic Metabolic Panel:    Component Value Date/Time   NA 138 11/17/2018 0446   K 3.3 (L) 11/17/2018 0446   CL 105 11/17/2018 0446   CO2 25 11/17/2018 0446   BUN 9 11/17/2018 0446   CREATININE 0.60 (L) 11/17/2018 0446   CREATININE 0.52 (L) 01/18/2017 1059   GLUCOSE 121 (H) 11/17/2018 0446   CALCIUM 7.6 (L) 11/17/2018 0446   CBC:    Component Value Date/Time   WBC 21.4 (H) 11/17/2018 0446   HGB 6.6 (LL) 11/17/2018 0446   HCT 19.3 (L) 11/17/2018 0446   PLT 382 11/17/2018 0446   MCV 86.9 11/17/2018 0446   NEUTROABS 14.3 (H) 11/17/2018 0446   LYMPHSABS 5.4 (H) 11/17/2018 0446   MONOABS 1.5 (H) 11/17/2018 0446   EOSABS 0.2 11/17/2018 0446   BASOSABS 0.0 11/17/2018 0446    Recent Results (from the past 240 hour(s))  Culture, blood (routine x 2)     Status: None (Preliminary result)   Collection Time: 11/16/18  4:37 PM  Result Value Ref Range Status   Specimen Description   Final    BLOOD BLOOD RIGHT FOREARM Performed at [facility], [address] [place], [place] [address]    Special Requests  Final    BOTTLES DRAWN AEROBIC AND ANAEROBIC Blood Culture adequate volume Performed at [facility], [address], [place], [place] [address]    Culture   Final    NO GROWTH < 12 HOURS Performed at [facility], [address], [place], [place] [address]    Report Status PENDING  Incomplete  Culture, blood (routine x 2)     Status: None (Preliminary result)   Collection Time: 11/16/18  5:14 PM  Result Value Ref Range Status   Specimen Description   Final    LEFT ANTECUBITAL Performed at [facility], [address] [place], [place] [address]    Special Requests    Final    BOTTLES DRAWN AEROBIC AND ANAEROBIC Blood Culture results may not be optimal due to an excessive volume of blood received in culture bottles Performed at [facility], [address], [place], [place] [address]    Culture   Final    NO GROWTH < 12 HOURS Performed at [facility], [address], [place], [place] [address]    Report Status PENDING  Incomplete  Respiratory Panel by PCR     Status: None   Collection Time: 11/16/18  5:14 PM  Result Value Ref Range Status   Adenovirus NOT DETECTED NOT DETECTED Final   Coronavirus 229E NOT DETECTED NOT DETECTED Final    Comment: (NOTE) The Coronavirus on the Respiratory Panel, DOES NOT test for the novel  Coronavirus (2019 nCoV)    Coronavirus HKU1 NOT DETECTED NOT DETECTED Final   Coronavirus NL63 NOT DETECTED NOT DETECTED Final   Coronavirus OC43 NOT DETECTED NOT DETECTED Final   Metapneumovirus NOT DETECTED NOT DETECTED Final   Rhinovirus / Enterovirus NOT DETECTED NOT DETECTED Final   Influenza A NOT DETECTED NOT DETECTED Final   Influenza B NOT DETECTED NOT DETECTED Final   Parainfluenza Virus 1 NOT DETECTED NOT DETECTED Final   Parainfluenza Virus 2 NOT DETECTED NOT DETECTED Final   Parainfluenza Virus 3 NOT DETECTED NOT DETECTED Final   Parainfluenza Virus 4 NOT DETECTED NOT DETECTED Final   Respiratory Syncytial Virus NOT DETECTED NOT DETECTED Final   Bordetella pertussis NOT DETECTED NOT DETECTED Final   Chlamydophila pneumoniae NOT DETECTED NOT DETECTED Final   Mycoplasma pneumoniae NOT DETECTED NOT DETECTED Final    Comment: Performed at [facility], [address] [place], [place] [address]  Culture, blood (routine x 2) Call MD if unable to obtain prior to antibiotics being given     Status: None (Preliminary result)   Collection Time: 11/16/18  8:00 PM  Result Value Ref Range Status   Specimen Description   Final    BLOOD LEFT ANTECUBITAL Performed at [facility], [address], [place], [place] [address]    Special Requests   Final    BOTTLES DRAWN AEROBIC AND ANAEROBIC Blood Culture results may not be optimal due to an excessive volume of blood received in culture bottles Performed at [facility], [address], [place], [place] [address]    Culture   Final    NO GROWTH < 12 HOURS Performed at [facility], [address] [place], [place] [address]    Report Status PENDING  Incomplete    Studies/Results: Dg Chest 2 View  Result Date: 11/16/2018 CLINICAL DATA:  Nonproductive cough for 5 days, hard to take a deep breath, 86% oxygen saturation on room  air, history sickle cell anemia EXAM: CHEST - 2 VIEW COMPARISON:  None FINDINGS: Enlargement of cardiac silhouette with pulmonary vascular congestion consistent with sickle cell disease. Mediastinal contours normal. LEFT lower lobe consolidation consistent with pneumonia. Remaining lungs clear. No pleural effusion or pneumothorax. IMPRESSION: Enlargement of cardiac silhouette with pulmonary vascular congestion consistent with history of sickle cell disease. LEFT lower lobe pneumonia. Electronically Signed   By: Ulyses SouthwardMark  Boles M.D.   On: 11/16/2018 16:39    Medications: Scheduled Meds: . doxycycline  100 mg Oral Q12H  . folic acid  1 mg Oral Daily  . hydroxyurea  1,500 mg Oral Daily  . lisinopril  2.5 mg Oral Daily   Continuous Infusions: . cefTRIAXone (ROCEPHIN)  IV    . dextrose 5 % and 0.45% NaCl 125 mL/hr at 11/17/18 1002   PRN Meds:.acetaminophen, ibuprofen, oxyCODONE-acetaminophen **AND** oxyCODONE  Consultants:  None  Procedures:  None  Antibiotics:  Ceftriaxone and Doxycycline  Assessment/Plan: Principal Problem:   PNA (pneumonia) Active Problems:   Sickle cell anemia (HCC)   SIRS (systemic inflammatory response syndrome) (HCC)  1. Left Lower Lobe Pneumonia: Respiratory panel by PCR negative so far. Patient now saturating well on room  air, denies shortness of breath.  We will continue antibiotics. Awaiting sputum culture and blood cultures. Non-toxic. Hemodynamically stable. 2. Systemic inflammatory response syndrome: Lactic acid is normal. Continue IV antibiotics. Monitor vital signs closely. 3. Hb Sickle Cell Disease with crisis: Continue IVF D5 .45% Saline @ 125 mls/hour, continue Ibuprofen and Oxycodone prn, Monitor vitals very closely, Re-evaluate pain scale regularly, 2 L of Oxygen by [place]. 4. Sickle Cell Anemia: Patient is status post blood transfusion of 1 unit packed red blood cell, will monitor hemoglobin concentration and transfuse as needed.  Code Status: Full Code Family Communication: N/A Disposition Plan: Not yet ready for discharge  Muna Demers  If 7PM-7AM, please contact night-coverage.  11/17/2018, 12:11 PM  LOS: 1 day

## 2018-11-18 ENCOUNTER — Inpatient Hospital Stay (HOSPITAL_COMMUNITY): Payer: Medicaid Other

## 2018-11-18 DIAGNOSIS — D5700 Hb-SS disease with crisis, unspecified: Secondary | ICD-10-CM

## 2018-11-18 DIAGNOSIS — R5081 Fever presenting with conditions classified elsewhere: Secondary | ICD-10-CM

## 2018-11-18 DIAGNOSIS — J189 Pneumonia, unspecified organism: Principal | ICD-10-CM

## 2018-11-18 DIAGNOSIS — D649 Anemia, unspecified: Secondary | ICD-10-CM

## 2018-11-18 DIAGNOSIS — R05 Cough: Secondary | ICD-10-CM

## 2018-11-18 DIAGNOSIS — R6510 Systemic inflammatory response syndrome (SIRS) of non-infectious origin without acute organ dysfunction: Secondary | ICD-10-CM

## 2018-11-18 DIAGNOSIS — D5701 Hb-SS disease with acute chest syndrome: Secondary | ICD-10-CM

## 2018-11-18 DIAGNOSIS — R059 Cough, unspecified: Secondary | ICD-10-CM

## 2018-11-18 LAB — COMPREHENSIVE METABOLIC PANEL
ALT: 16 U/L (ref 0–44)
AST: 36 U/L (ref 15–41)
Albumin: 3.6 g/dL (ref 3.5–5.0)
Alkaline Phosphatase: 41 U/L (ref 38–126)
Anion gap: 10 (ref 5–15)
BILIRUBIN TOTAL: 5.9 mg/dL — AB (ref 0.3–1.2)
BUN: 5 mg/dL — ABNORMAL LOW (ref 6–20)
CHLORIDE: 105 mmol/L (ref 98–111)
CO2: 22 mmol/L (ref 22–32)
Calcium: 8.2 mg/dL — ABNORMAL LOW (ref 8.9–10.3)
Creatinine, Ser: 0.4 mg/dL — ABNORMAL LOW (ref 0.61–1.24)
GFR calc Af Amer: >60 mL/min (ref >60)
GFR calc non Af Amer: >60 mL/min (ref >60)
Glucose, Bld: 83 mg/dL (ref 70–99)
POTASSIUM: 4.2 mmol/L (ref 3.5–5.1)
Sodium: 137 mmol/L (ref 135–145)
Total Protein: 7.7 g/dL (ref 6.5–8.1)

## 2018-11-18 LAB — CBC WITH DIFFERENTIAL/PLATELET
Abs Immature Granulocytes: 0.21 10*3/uL — ABNORMAL HIGH (ref 0.00–0.07)
BASOS PCT: 0 %
Basophils Absolute: 0.1 10*3/uL (ref 0.0–0.1)
Eosinophils Absolute: 0.4 10*3/uL (ref 0.0–0.5)
Eosinophils Relative: 2 %
HCT: 21.2 % — ABNORMAL LOW (ref 39.0–52.0)
Hemoglobin: 7 g/dL — ABNORMAL LOW (ref 13.0–17.0)
Immature Granulocytes: 1 %
Lymphocytes Relative: 22 %
Lymphs Abs: 4.6 10*3/uL — ABNORMAL HIGH (ref 0.7–4.0)
MCH: 30 pg (ref 26.0–34.0)
MCHC: 33 g/dL (ref 30.0–36.0)
MCV: 91 fL (ref 80.0–100.0)
Monocytes Absolute: 1.1 10*3/uL — ABNORMAL HIGH (ref 0.1–1.0)
Monocytes Relative: 5 %
Neutro Abs: 14.9 10*3/uL — ABNORMAL HIGH (ref 1.7–7.7)
Neutrophils Relative %: 70 %
Platelets: 432 10*3/uL — ABNORMAL HIGH (ref 150–400)
RBC: 2.33 MIL/uL — ABNORMAL LOW (ref 4.22–5.81)
RDW: 23.6 % — AB (ref 11.5–15.5)
WBC: 21.3 10*3/uL — ABNORMAL HIGH (ref 4.0–10.5)
nRBC: 0.3 % — ABNORMAL HIGH (ref 0.0–0.2)

## 2018-11-18 LAB — URINE CULTURE: Culture: NO GROWTH

## 2018-11-18 MED ORDER — ALBUTEROL SULFATE (2.5 MG/3ML) 0.083% IN NEBU
2.5000 mg | INHALATION_SOLUTION | RESPIRATORY_TRACT | Status: DC | PRN
  Administered 2018-11-18: 2.5 mg via RESPIRATORY_TRACT

## 2018-11-18 MED ORDER — ONDANSETRON HCL 4 MG/2ML IJ SOLN: 4.0000 mg | Freq: Four times a day (QID) | INTRAMUSCULAR | Status: DC | PRN

## 2018-11-18 NOTE — Progress Notes (Signed)
Subjective: William Gallegos, a 18 year old male with a medical history significant for sickle cell anemia was admitted with left lower lobe pneumonia and acute on chronic anemia. Patient endorses mild pain to mid chest.  He denies headache, shortness of breath.  Patient has been afebrile overnight.  He is maintaining oxygen saturation above 90% on RA. Hemoglobin has improved to 7.0.  Patient is status post 1 unit of packed red blood cells.  WBCs continue to be elevated at 21.3.    Objective:  Vital signs in last 24 hours:  Vitals:   11/17/18 1835 11/17/18 2135 11/18/18 0416 11/18/18 0925  BP:  108/63 103/88   Pulse:  72 85   Resp: 20 16 20    Temp: 100.2 F (37.9 C) 98.6 F (37 C) 99.8 F (37.7 C)   TempSrc: Oral Oral Oral   SpO2: 92% 100% 100% (!) 82%  Weight:      Height:        Intake/Output from previous day:   Intake/Output Summary (Last 24 hours) at 11/18/2018 1145 Last data filed at 11/18/2018 0700 Gross per 24 hour  Intake 2297.91 ml  Output 1800 ml  Net 497.91 ml    Physical Exam: General: Alert, awake, oriented x3, in no acute distress.  HEENT: [place]/AT PEERL, EOMI Neck: Trachea midline,  no masses, no thyromegal,y no JVD, no carotid bruit OROPHARYNX:  Moist, No exudate/ erythema/lesions.  Heart: Regular rate and rhythm, without murmurs, rubs, gallops, PMI non-displaced, no heaves or thrills on palpation.  Lungs: Clear to auscultation, no wheezing or rhonchi noted. No increased vocal fremitus resonant to percussion  Abdomen: Soft, nontender, nondistended, positive bowel sounds, no masses no hepatosplenomegaly noted..  Neuro: No focal neurological deficits noted cranial nerves II through XII grossly intact. DTRs 2+ bilaterally upper and lower extremities. Strength 5 out of 5 in bilateral upper and lower extremities. Musculoskeletal: No warm swelling or erythema around joints, no spinal tenderness noted. Psychiatric: Patient alert and oriented x3, good insight and  cognition, good recent to remote recall. Lymph node survey: No cervical axillary or inguinal lymphadenopathy noted.  Lab Results:  Basic Metabolic Panel:    Component Value Date/Time   NA 137 11/18/2018 0427   K 4.2 11/18/2018 0427   CL 105 11/18/2018 0427   CO2 22 11/18/2018 0427   BUN 5 (L) 11/18/2018 0427   CREATININE 0.40 (L) 11/18/2018 0427   CREATININE 0.52 (L) 01/18/2017 1059   GLUCOSE 83 11/18/2018 0427   CALCIUM 8.2 (L) 11/18/2018 0427   CBC:    Component Value Date/Time   WBC 21.3 (H) 11/18/2018 0427   HGB 7.0 (L) 11/18/2018 0427   HCT 21.2 (L) 11/18/2018 0427   PLT 432 (H) 11/18/2018 0427   MCV 91.0 11/18/2018 0427   NEUTROABS 14.9 (H) 11/18/2018 0427   LYMPHSABS 4.6 (H) 11/18/2018 0427   MONOABS 1.1 (H) 11/18/2018 0427   EOSABS 0.4 11/18/2018 0427   BASOSABS 0.1 11/18/2018 0427    Recent Results (from the past 240 hour(s))  Culture, blood (routine x 2)     Status: None (Preliminary result)   Collection Time: 11/16/18  4:37 PM  Result Value Ref Range Status   Specimen Description   Final    BLOOD BLOOD RIGHT FOREARM Performed at [facility], [address], [place], [place] [address]    Special Requests   Final    BOTTLES DRAWN AEROBIC AND ANAEROBIC Blood Culture adequate volume Performed at [facility], [address]  [place], [place] [address]    Culture   Final    NO GROWTH < 24 HOURS Performed at [facility], [address], [place], [place] [address]    Report Status PENDING  Incomplete  Culture, blood (routine x 2)     Status: None (Preliminary result)   Collection Time: 11/16/18  5:14 PM  Result Value Ref Range Status   Specimen Description BLOOD LEFT ANTECUBITAL  Final   Special Requests   Final    BOTTLES DRAWN AEROBIC AND ANAEROBIC Blood Culture results may not be optimal due to an excessive volume of blood received in culture bottles Performed at [facility], [address], [place], [place] [address]    Culture   Final    NO GROWTH < 24 HOURS Performed at [facility], [address], [place], [place] [address]    Report Status PENDING  Incomplete  Respiratory Panel by PCR     Status: None   Collection Time: 11/16/18  5:14 PM  Result Value Ref Range Status   Adenovirus NOT DETECTED NOT DETECTED Final   Coronavirus 229E NOT DETECTED NOT DETECTED Final    Comment: (NOTE) The Coronavirus on the Respiratory Panel, DOES NOT test for the novel  Coronavirus (2019 nCoV)    Coronavirus HKU1 NOT DETECTED NOT DETECTED Final   Coronavirus NL63 NOT DETECTED NOT DETECTED Final   Coronavirus OC43 NOT DETECTED NOT DETECTED Final   Metapneumovirus NOT DETECTED NOT DETECTED Final   Rhinovirus / Enterovirus NOT DETECTED NOT DETECTED Final   Influenza A NOT DETECTED NOT DETECTED Final   Influenza B NOT DETECTED NOT DETECTED Final   Parainfluenza Virus 1 NOT DETECTED NOT DETECTED Final   Parainfluenza Virus 2 NOT DETECTED NOT DETECTED Final   Parainfluenza Virus 3 NOT DETECTED NOT DETECTED Final   Parainfluenza Virus 4 NOT DETECTED NOT DETECTED Final   Respiratory Syncytial Virus NOT DETECTED NOT DETECTED Final   Bordetella pertussis NOT DETECTED NOT DETECTED Final   Chlamydophila pneumoniae NOT DETECTED NOT DETECTED Final   Mycoplasma pneumoniae NOT DETECTED NOT DETECTED Final    Comment: Performed at [facility], [address] [place], [place] [address]  Culture, blood (routine x 2) Call MD if unable to obtain prior to antibiotics being given     Status: None (Preliminary result)   Collection Time: 11/16/18  8:00 PM  Result Value Ref Range Status   Specimen Description   Final    BLOOD LEFT ANTECUBITAL Performed at [facility], [address] [place], [place] [address]    Special Requests   Final    BOTTLES DRAWN AEROBIC AND ANAEROBIC Blood Culture results may not be optimal due to an excessive volume of blood received  in culture bottles Performed at [facility], [address] [place], [place] [address]    Culture   Final    NO GROWTH < 24 HOURS Performed at [facility], [address], [place], [place] [address]    Report Status PENDING  Incomplete  Urine culture     Status: None   Collection Time: 11/16/18 11:36 PM  Result Value Ref Range Status   Specimen Description   Final    URINE, RANDOM Performed at [facility], [address] [place], [place] [address]    Special Requests   Final    NONE Performed at [facility], [address] [place], [place]  [id number]    Culture   Final    NO GROWTH Performed at [facility], [address] [place], [place] [id number]    Report Status 11/18/2018 FINAL  Final    Studies/Results: Dg Chest 2 View  Result Date: 11/18/2018 CLINICAL DATA:  Cough, pneumonia. EXAM: CHEST - 2 VIEW COMPARISON:  Radiographs of November 16, 2018. FINDINGS: Stable cardiomegaly. No pneumothorax is noted. Increased left perihilar and basilar opacity is noted consistent with pneumonia. Right lung is clear. Bony thorax is unremarkable. IMPRESSION: Increased left perihilar and basilar opacity is noted suggesting worsening pneumonia. Electronically Signed   By: Lupita RaiderJames  Green Jr, M.D.   On: 11/18/2018 08:42   Dg Chest 2 View  Result Date: 11/16/2018 CLINICAL DATA:  Nonproductive cough for 5 days, hard to take a deep breath, 86% oxygen saturation on room air, history sickle cell anemia EXAM: CHEST - 2 VIEW COMPARISON:  None FINDINGS: Enlargement of cardiac silhouette with pulmonary vascular congestion consistent with sickle cell disease. Mediastinal contours normal. LEFT lower lobe consolidation consistent with pneumonia. Remaining lungs clear. No pleural effusion or pneumothorax. IMPRESSION: Enlargement of cardiac silhouette with pulmonary vascular congestion consistent with history of sickle cell disease. LEFT  lower lobe pneumonia. Electronically Signed   By: Ulyses SouthwardMark  Boles M.D.   On: 11/16/2018 16:39    Medications: Scheduled Meds: . doxycycline  100 mg Oral Q12H  . folic acid  1 mg Oral Daily  . hydroxyurea  1,500 mg Oral Daily  . lisinopril  2.5 mg Oral Daily   Continuous Infusions: . cefTRIAXone (ROCEPHIN)  IV 1 g (11/17/18 1624)  . dextrose 5 % and 0.45% NaCl 10 mL/hr at 11/18/18 1113   PRN Meds:.acetaminophen, albuterol, ibuprofen, ondansetron (ZOFRAN) IV, oxyCODONE-acetaminophen **AND** oxyCODONE  Assessment/Plan: Principal Problem:   PNA (pneumonia) Active Problems:   Sickle cell anemia (HCC)   SIRS (systemic inflammatory response syndrome) (HCC)  Left lower lobe pneumonia: Continue IV antibiotics Patient maintaining oxygen saturation above 90% on RA Sputum culture and blood cultures pending.  Systemic inflammatory response syndrome: Continue IV antibiotics and monitor vital signs closely.  Patient has been afebrile overnight  Sickle cell pain crisis: [facility] Continue ibuprofen and oxycodone as needed Continue to monitor vital signs closely Folic acid 1 mg daily  Anemia of chronic disease: Hemoglobin 7.0, which is improved from previous.  Patient is status post 1 unit of packed red blood cells. Repeat CBC in a.m.  Will repeat transfusion if hemoglobin is less than 6.0 g/dL  Leukocytosis: WBCs continue to be mildly elevated at 21.3.  Continue to monitor closely.  Patient afebrile.  Continue IV antibiotics Repeat CBC in a.m.  Code Status: Full Code Family Communication: N/A Disposition Plan: Not yet ready for discharge  Chaynce Schafer Rennis PettyMoore Jefferson Fullam  APRN, MSN, FNP-C [facility] [address]  [place], [place] [address] [phone]  If 5PM-7AM, please contact night-coverage.  11/18/2018, 11:45 AM  LOS: 2 days

## 2018-11-18 NOTE — Progress Notes (Signed)
MEWS Yellow   No acute change noted. PRN Tylenol given for fever. Patient states no pain and/or distress. Ocie Cornfield, Charge Nurse notified. Will continue to follow.

## 2018-11-19 LAB — CBC
HCT: 17.4 % — ABNORMAL LOW (ref 39.0–52.0)
Hemoglobin: 6 g/dL — CL (ref 13.0–17.0)
MCH: 30.6 pg (ref 26.0–34.0)
MCHC: 34.5 g/dL (ref 30.0–36.0)
MCV: 88.8 fL (ref 80.0–100.0)
Platelets: 409 10*3/uL — ABNORMAL HIGH (ref 150–400)
RBC: 1.96 MIL/uL — AB (ref 4.22–5.81)
RDW: 21.6 % — ABNORMAL HIGH (ref 11.5–15.5)
WBC: 19.5 10*3/uL — ABNORMAL HIGH (ref 4.0–10.5)
nRBC: 0.8 % — ABNORMAL HIGH (ref 0.0–0.2)

## 2018-11-19 LAB — PREPARE RBC (CROSSMATCH)

## 2018-11-19 MED ORDER — ACETAMINOPHEN 325 MG PO TABS
650.0000 mg | ORAL_TABLET | Freq: Once | ORAL | Status: AC
  Administered 2018-11-19: 650 mg via ORAL
  Filled 2018-11-19: qty 2

## 2018-11-19 MED ORDER — DIPHENHYDRAMINE HCL 50 MG/ML IJ SOLN
25.0000 mg | Freq: Once | INTRAMUSCULAR | Status: AC
  Administered 2018-11-19: 25 mg via INTRAVENOUS
  Filled 2018-11-19: qty 1

## 2018-11-19 NOTE — Plan of Care (Signed)
  Problem: Education: Goal: Knowledge of General Education information will improve Description Including pain rating scale, medication(s)/side effects and non-pharmacologic comfort measures Outcome: Progressing   Problem: Health Behavior/Discharge Planning: Goal: Ability to manage health-related needs will improve Outcome: Progressing   Problem: Clinical Measurements: Goal: Respiratory complications will improve Outcome: Progressing   Problem: Activity: Goal: Risk for activity intolerance will decrease Outcome: Progressing   Problem: Nutrition: Goal: Adequate nutrition will be maintained Outcome: Progressing   Problem: Safety: Goal: Ability to remain free from injury will improve Outcome: Progressing   

## 2018-11-19 NOTE — Progress Notes (Signed)
Subjective: William Gallegos, a [age] year old male with a medical history significant for sickle cell anemia was admitted with left lower lobe pneumonia and acute on chronic anemia.  Patient denies pain at this time.  Patient has been afebrile overnight and is maintaining oxygen saturation above 90% on RA.  Hemoglobin has decreased to 6.0 today.  Patient is status post 1 unit of packed red blood cells.  WBCs continue to trend down appropriately.  Patient denies headache, dizziness, paresthesias, chest pain, shortness of breath, nausea, vomiting, or diarrhea. He endorses periodic cough.   Objective:  Vital signs in last 24 hours:  Vitals:   11/18/18 1850 11/18/18 2110 11/19/18 0650 11/19/18 1029  BP: 119/67 111/64 112/63 (!) 112/59  Pulse: 77 78 77 87  Resp:  15 18   Temp: 99.3 F (37.4 C) 99.1 F (37.3 C) 99.3 F (37.4 C)   TempSrc: Oral Oral Oral   SpO2: 99% 99% 100%   Weight:      Height:        Intake/Output from previous day:   Intake/Output Summary (Last 24 hours) at 11/19/2018 1124 Last data filed at 11/19/2018 0547 Gross per 24 hour  Intake 1301.96 ml  Output 900 ml  Net 401.96 ml    Physical Exam: General: Alert, awake, oriented x3, in no acute distress.  HEENT: [place]/AT PEERL, EOMI Neck: Trachea midline,  no masses, no thyromegal,y no JVD, no carotid bruit OROPHARYNX:  Moist, No exudate/ erythema/lesions.  Heart: Regular rate and rhythm, without murmurs, rubs, gallops, PMI non-displaced, no heaves or thrills on palpation.  Lungs: Clear to auscultation, no wheezing or rhonchi noted. No increased vocal fremitus resonant to percussion  Abdomen: Soft, nontender, nondistended, positive bowel sounds, no masses no hepatosplenomegaly noted..  Neuro: No focal neurological deficits noted cranial nerves II through XII grossly intact. DTRs 2+ bilaterally upper and lower extremities. Strength 5 out of 5 in bilateral upper and lower extremities. Musculoskeletal: No warm swelling or  erythema around joints, no spinal tenderness noted. Psychiatric: Patient alert and oriented x3, good insight and cognition, good recent to remote recall. Lymph node survey: No cervical axillary or inguinal lymphadenopathy noted.  Lab Results:  Basic Metabolic Panel:    Component Value Date/Time   NA 137 11/18/2018 0427   K 4.2 11/18/2018 0427   CL 105 11/18/2018 0427   CO2 22 11/18/2018 0427   BUN 5 (L) 11/18/2018 0427   CREATININE 0.40 (L) 11/18/2018 0427   CREATININE 0.52 (L) 01/18/2017 1059   GLUCOSE 83 11/18/2018 0427   CALCIUM 8.2 (L) 11/18/2018 0427   CBC:    Component Value Date/Time   WBC 19.5 (H) 11/19/2018 0400   HGB 6.0 (LL) 11/19/2018 0400   HCT 17.4 (L) 11/19/2018 0400   PLT 409 (H) 11/19/2018 0400   MCV 88.8 11/19/2018 0400   NEUTROABS 14.9 (H) 11/18/2018 0427   LYMPHSABS 4.6 (H) 11/18/2018 0427   MONOABS 1.1 (H) 11/18/2018 0427   EOSABS 0.4 11/18/2018 0427   BASOSABS 0.1 11/18/2018 0427    Recent Results (from the past 240 hour(s))  Culture, blood (routine x 2)     Status: None (Preliminary result)   Collection Time: 11/16/18  4:37 PM  Result Value Ref Range Status   Specimen Description   Final    BLOOD BLOOD RIGHT FOREARM Performed at [facility], [address] [place], [place] [id number]    Special Requests   Final    BOTTLES DRAWN AEROBIC AND ANAEROBIC Blood Culture adequate  volume Performed at [facility], [address] [place], [place] [address]    Culture   Final    NO GROWTH 3 DAYS Performed at [facility], [address] [place], [place] [address]    Report Status PENDING  Incomplete  Culture, blood (routine x 2)     Status: None (Preliminary result)   Collection Time: 11/16/18  5:14 PM  Result Value Ref Range Status   Specimen Description BLOOD LEFT ANTECUBITAL  Final   Special Requests   Final    BOTTLES DRAWN AEROBIC AND ANAEROBIC Blood Culture results may not be optimal due to an  excessive volume of blood received in culture bottles Performed at [facility], [address] [place], [place] [address]    Culture   Final    NO GROWTH 3 DAYS Performed at [facility], [address] [place], [place] [address]    Report Status PENDING  Incomplete  Respiratory Panel by PCR     Status: None   Collection Time: 11/16/18  5:14 PM  Result Value Ref Range Status   Adenovirus NOT DETECTED NOT DETECTED Final   Coronavirus 229E NOT DETECTED NOT DETECTED Final    Comment: (NOTE) The Coronavirus on the Respiratory Panel, DOES NOT test for the novel  Coronavirus (2019 nCoV)    Coronavirus HKU1 NOT DETECTED NOT DETECTED Final   Coronavirus NL63 NOT DETECTED NOT DETECTED Final   Coronavirus OC43 NOT DETECTED NOT DETECTED Final   Metapneumovirus NOT DETECTED NOT DETECTED Final   Rhinovirus / Enterovirus NOT DETECTED NOT DETECTED Final   Influenza A NOT DETECTED NOT DETECTED Final   Influenza B NOT DETECTED NOT DETECTED Final   Parainfluenza Virus 1 NOT DETECTED NOT DETECTED Final   Parainfluenza Virus 2 NOT DETECTED NOT DETECTED Final   Parainfluenza Virus 3 NOT DETECTED NOT DETECTED Final   Parainfluenza Virus 4 NOT DETECTED NOT DETECTED Final   Respiratory Syncytial Virus NOT DETECTED NOT DETECTED Final   Bordetella pertussis NOT DETECTED NOT DETECTED Final   Chlamydophila pneumoniae NOT DETECTED NOT DETECTED Final   Mycoplasma pneumoniae NOT DETECTED NOT DETECTED Final    Comment: Performed at [facility], [address], [place], [place] [address]  Culture, blood (routine x 2) Call MD if unable to obtain prior to antibiotics being given     Status: None (Preliminary result)   Collection Time: 11/16/18  8:00 PM  Result Value Ref Range Status   Specimen Description   Final    BLOOD LEFT ANTECUBITAL Performed at [facility], [address], [place], [place] [address]    Special Requests   Final    BOTTLES DRAWN  AEROBIC AND ANAEROBIC Blood Culture results may not be optimal due to an excessive volume of blood received in culture bottles Performed at [facility], [address], [place], [place] [address]    Culture   Final    NO GROWTH 3 DAYS Performed at [facility], [address] [place], [place] [address]    Report Status PENDING  Incomplete  Urine culture     Status: None   Collection Time: 11/16/18 11:36 PM  Result Value Ref Range Status   Specimen Description   Final    URINE, RANDOM Performed at [facility], [address] [place], [place] [address]    Special Requests   Final    NONE Performed at [facility], [address] [place], [place] [address]    Culture   Final    NO GROWTH Performed at [facility], [address], [place], [place] [address]    Report Status 11/18/2018 FINAL  Final    Studies/Results: Dg Chest 2 View  Result Date: 11/18/2018 CLINICAL DATA:  Cough, pneumonia. EXAM: CHEST - 2 VIEW COMPARISON:  Radiographs of November 16, 2018. FINDINGS: Stable cardiomegaly. No pneumothorax is noted. Increased left perihilar and basilar opacity is noted consistent with pneumonia. Right lung is clear. Bony thorax is unremarkable. IMPRESSION: Increased left perihilar and basilar opacity is noted suggesting worsening pneumonia. Electronically Signed   By: Lupita Raider, M.D.   On: 11/18/2018 08:42    Medications: Scheduled Meds: . acetaminophen  650 mg Oral Once  . diphenhydrAMINE  25 mg Intravenous Once  . doxycycline  100 mg Oral Q12H  . folic acid  1 mg Oral Daily  . lisinopril  2.5 mg Oral Daily   Continuous Infusions: . cefTRIAXone (ROCEPHIN)  IV Stopped (11/18/18 1559)  . dextrose 5 % and 0.45% NaCl 10 mL/hr at 11/18/18 1800   PRN Meds:.acetaminophen, albuterol, ibuprofen, ondansetron (ZOFRAN) IV, oxyCODONE-acetaminophen **AND** oxyCODONE  Assessment/Plan: Principal Problem:   PNA  (pneumonia) Active Problems:   Sickle cell anemia (HCC)   Fever   SIRS (systemic inflammatory response syndrome) (HCC)   Acute chest syndrome in sickle crisis (HCC)   Anemia   Cough  Left lower lobe pneumonia: Continue IV antibiotics Patient maintaining oxygen saturation above 90% on RA Sputum culture and blood cultures pending.  Systemic inflammatory response syndrome: Continue IV antibiotics and monitor vital signs closely.  Patient has been afebrile overnight  Sickle cell pain crisis: Continue IV fluids to [facility] Continue ibuprofen and oxycodone as needed Continue to monitor vital signs closely Folic acid 1 mg daily  Anemia of chronic disease: Hemoglobin 6.0, which is below baseline.  Will transfuse 1 unit of packed red blood cells today.  Follow posttransfusion H&H.    Leukocytosis: WBCs stable.  Continue to monitor closely.  Patient afebrile.  Continue IV antibiotics Repeat CBC in a.m.  Code Status: Full Code Family Communication: N/A Disposition Plan: Not yet ready for discharge  Akita Maxim Rennis Petty  APRN, MSN, FNP-C [facility] [address] [place], [place] [address] [phone]  If 5PM-7AM, please contact night-coverage.  11/19/2018, 11:24 AM  LOS: 3 days

## 2018-11-19 NOTE — Progress Notes (Signed)
CRITICAL VALUE ALERT  Critical Value:  Hemoglobin 6.0  Date & Time Notied:  02/18 @ 0431  Provider Notified: Donnamarie Poag  Orders Received/Actions taken: waiting for orders

## 2018-11-19 NOTE — Progress Notes (Signed)
PHARMACY BRIEF NOTE: HYDROXYUREA   By [facility] policy, hydroxyurea is automatically held when any of the following laboratory values occur:  ANC < 2 K  Pltc < 80K in sickle-cell patients; < 100K in other patients  Hgb <= 6 in sickle-cell patients (hgb 6 on 2/18); < 8 in other patients  Reticulocytes < 80K when Hgb < 9  Hydroxyurea has been held (discontinued from profile) per policy.   Dorna Leitz, PharmD, BCPS 11/19/2018 8:49 AM

## 2018-11-20 LAB — TYPE AND SCREEN
ABO/RH(D): A POS
Antibody Screen: NEGATIVE
UNIT DIVISION: 0
Unit division: 0

## 2018-11-20 LAB — BPAM RBC
Blood Product Expiration Date: 202003062359
Blood Product Expiration Date: 202003262359
ISSUE DATE / TIME: 202002181659
ISSUE DATE / TIME: 202002152156
Unit Type and Rh: 5100
Unit Type and Rh: 5100

## 2018-11-20 LAB — CBC
HEMATOCRIT: 20.5 % — AB (ref 39.0–52.0)
HEMOGLOBIN: 7.2 g/dL — AB (ref 13.0–17.0)
MCH: 30.9 pg (ref 26.0–34.0)
MCHC: 35.1 g/dL (ref 30.0–36.0)
MCV: 88 fL (ref 80.0–100.0)
Platelets: 472 10*3/uL — ABNORMAL HIGH (ref 150–400)
RBC: 2.33 MIL/uL — ABNORMAL LOW (ref 4.22–5.81)
RDW: 20.2 % — ABNORMAL HIGH (ref 11.5–15.5)
WBC: 16.2 10*3/uL — ABNORMAL HIGH (ref 4.0–10.5)
nRBC: 2 % — ABNORMAL HIGH (ref 0.0–0.2)

## 2018-11-20 MED ORDER — OXYCODONE-ACETAMINOPHEN 5-325 MG PO TABS: 1.0000 | ORAL_TABLET | Freq: Three times a day (TID) | ORAL | 0 refills | Status: DC | PRN

## 2018-11-20 MED ORDER — DOXYCYCLINE HYCLATE 100 MG PO TABS: 100.0000 mg | ORAL_TABLET | Freq: Two times a day (BID) | ORAL | 0 refills | Status: DC

## 2018-11-20 NOTE — Progress Notes (Signed)
SATURATION QUALIFICATIONS: (This note is used to comply with regulatory documentation for home oxygen)  Patient Saturations on Room Air at Rest = 95%  Patient Saturations on Room Air while Ambulating = 96%  Please briefly explain why patient needs home oxygen:

## 2018-11-20 NOTE — Plan of Care (Signed)
  Problem: Education: Goal: Knowledge of General Education information will improve Description Including pain rating scale, medication(s)/side effects and non-pharmacologic comfort measures 11/20/2018 0944 by Meriel Pica, RN Outcome: Completed/Met 11/19/2018 2009 by Meriel Pica, RN Outcome: Progressing   Problem: Health Behavior/Discharge Planning: Goal: Ability to manage health-related needs will improve 11/20/2018 0944 by Meriel Pica, RN Outcome: Completed/Met 11/19/2018 2009 by Meriel Pica, RN Outcome: Progressing   Problem: Clinical Measurements: Goal: Ability to maintain clinical measurements within normal limits will improve Outcome: Completed/Met Goal: Will remain free from infection Outcome: Completed/Met Goal: Diagnostic test results will improve Outcome: Completed/Met Goal: Respiratory complications will improve 11/20/2018 0944 by Meriel Pica, RN Outcome: Completed/Met 11/19/2018 2009 by Meriel Pica, RN Outcome: Progressing Goal: Cardiovascular complication will be avoided Outcome: Completed/Met   Problem: Activity: Goal: Risk for activity intolerance will decrease 11/20/2018 0944 by Meriel Pica, RN Outcome: Completed/Met 11/19/2018 2009 by Meriel Pica, RN Outcome: Progressing   Problem: Nutrition: Goal: Adequate nutrition will be maintained 11/20/2018 0944 by Meriel Pica, RN Outcome: Completed/Met 11/19/2018 2009 by Meriel Pica, RN Outcome: Progressing   Problem: Coping: Goal: Level of anxiety will decrease Outcome: Completed/Met   Problem: Elimination: Goal: Will not experience complications related to bowel motility Outcome: Completed/Met Goal: Will not experience complications related to urinary retention Outcome: Completed/Met   Problem: Pain Managment: Goal: General experience of comfort will improve Outcome: Completed/Met   Problem: Safety: Goal: Ability to remain free from injury will  improve 11/20/2018 0944 by Meriel Pica, RN Outcome: Completed/Met 11/19/2018 2009 by Meriel Pica, RN Outcome: Progressing   Problem: Skin Integrity: Goal: Risk for impaired skin integrity will decrease Outcome: Completed/Met

## 2018-11-20 NOTE — Discharge Summary (Signed)
Physician Discharge Summary  William Gallegos EHU:[id number] DOB: [birth year] DOA: 11/16/2018  PCP: Bing Neighbors, FNP  Admit date: 11/16/2018  Discharge date: 11/20/2018  Discharge Diagnoses:  Principal Problem:   PNA (pneumonia) Active Problems:   Sickle cell anemia (HCC)   Fever   SIRS (systemic inflammatory response syndrome) (HCC)   Acute chest syndrome in sickle crisis (HCC)   Anemia   Cough   Discharge Condition: Stable  Disposition:  Follow-up Information    [facility] Follow up.   Specialty:  Internal Medicine Why:  Repeat Chest xray in 4 weeks CBC in 1 week Contact information: [address] [id number] mc [place] [address] [phone]       [facility] .   Specialty:  Internal Medicine Contact information: [address] [place] [address] [phone]         Pt is discharged home in good condition and is to follow up with [name] Godfrey Pick, FNP this week to have labs evaluated. William Gallegos is instructed to increase activity slowly and balance with rest for the next few days, and use prescribed medication to complete treatment of pain  Diet: Regular Wt Readings from Last 3 Encounters:  11/16/18 70.3 kg  11/11/17 68 kg (46 %, Z= -0.10)*  05/08/17 66.8 kg (45 %, Z= -0.13)*   * Growth percentiles are based on CDC (Boys, 2-20 Years) data.    History of present illness:  William Gallegos, a 18 year old male with a medical history significant for sickle cell anemia presents with 5 days of upper respiratory tract infection that started off with nasal congestion and cough.  Patient was prescribed amoxicillin, he took 1 dose but did not complete therapy.  Approximately 2 days prior to admission patient started running high fevers.  He denies any sick contacts.  Cough is been productive.  Patient denies nausea, vomiting, or diarrhea.  He also denies any rashes. Patient referred  for admission for the finding of pneumonia in the setting of sickle cell anemia/crisis.  On arrival, oxygen saturation was 86% on RA, which normalized following 2 L of oxygen.  Patient normally does not have any supplemental oxygen requirements at home chronically.  ER course: Chest x-ray shows enlargement of cardiac silhouette with pulmonary vascular congestion consistent with a history of sickle cell disease.  Also, left lower lobe pneumonia.  Also, hemoglobin decreased to 6.6, which is below patient's baseline of 7.0-8.0 and WBC 6.4.  Respiratory panel negative. Admitted to telemetry for left lower lobe pneumonia in the presence of sickle cell pain crisis.  Hospital Course:   Left lower lobe pneumonia: Patient was admitted with left lower lobe pneumonia and treated with ceftriaxone 1 g and doxycycline 100 mg twice daily.  Patient is using incentive spirometer appropriately and maintaining oxygen saturation above 90% on RA.  Also, no desaturation occurs with ambulation.  Patient will continue therapy with doxycycline 100 mg twice daily for an additional 4 days.  Also, will schedule follow-up appointment in primary care to repeat CBC in 1 week.  Patient will warrant repeat chest x-ray in 4 weeks to ensure resolution of left lower lobe pneumonia.  Sickle cell pain crisis: Patient was also admitted for sickle cell pain crisis and managed appropriately with IV fluids, Percocet 5-3 25, and ibuprofen.  Patient was also treated with other adjunct therapies per sickle cell pain management protocol. Pain resolved throughout admission.  Prior to discharge patient does not have  pain. Patient alert, oriented, and ambulating without assistance.  Patient will discharge home today in a hemodynamically stable condition.  Patient is aware of scheduled appointments and of medications that have been sent to his pharmacy.  Questions for me providing Discharge Exam: Vitals:   11/20/18 0439 11/20/18 0551  BP: (!)  114/57   Pulse: 76   Resp: 16   Temp: 100 F (37.8 C) 99.2 F (37.3 C)  SpO2: 99%    Vitals:   11/19/18 1937 11/20/18 0150 11/20/18 0439 11/20/18 0551  BP: 107/63 111/64 (!) 114/57   Pulse: 92 92 76   Resp: (!) 24 (!) 25 16   Temp: 99.2 F (37.3 C) (!) 100.9 F (38.3 C) 100 F (37.8 C) 99.2 F (37.3 C)  TempSrc: Oral Oral Oral Oral  SpO2: 98% 96% 99%   Weight:      Height:        General appearance : Awake, alert, not in any distress. Speech Clear. Not toxic looking HEENT: Atraumatic and Normocephalic, pupils equally reactive to light and accomodation Neck: Supple, no JVD. No cervical lymphadenopathy.  Chest: Good air entry bilaterally, no added sounds  CVS: S1 S2 regular, no murmurs.  Abdomen: Bowel sounds present, Non tender and not distended with no gaurding, rigidity or rebound. Extremities: B/L Lower Ext shows no edema, both legs are warm to touch Neurology: Awake alert, and oriented X 3, CN II-XII intact, Non focal Skin: No Rash  Discharge Instructions  Discharge Instructions    Discharge patient   Complete by:  As directed    Discharge disposition:  01-Home or Self Care   Discharge patient date:  11/20/2018     Allergies as of 11/20/2018      Reactions   Carrot [daucus [place]] Anaphylaxis, Swelling   Swelling of face    Peanuts [peanut Oil] Anaphylaxis, Swelling   Swelling of face    Erythromycin Hives   Other Swelling   Raisins--- facial swelling Mushrooms---face swelling      Medication List    STOP taking these medications   amoxicillin 500 MG capsule Commonly known as:  AMOXIL   ibuprofen 600 MG tablet Commonly known as:  ADVIL,MOTRIN   oxyCODONE-acetaminophen 10-325 MG tablet Commonly known as:  PERCOCET Replaced by:  oxyCODONE-acetaminophen 5-325 MG tablet     TAKE these medications   doxycycline 100 MG tablet Commonly known as:  VIBRA-TABS Take 1 tablet (100 mg total) by mouth every 12 (twelve) hours.   folic acid 1 MG  tablet Commonly known as:  FOLVITE Take 1 mg by mouth daily.   hydroxyurea 500 MG capsule Commonly known as:  HYDREA TAKE 3 CAPSULES (1,500 MG TOTAL) BY MOUTH DAILY.   lisinopril 2.5 MG tablet Commonly known as:  PRINIVIL,ZESTRIL Take 2.5 mg by mouth daily.   naproxen 500 MG tablet Commonly known as:  NAPROSYN Take 1 tablet (500 mg total) by mouth 2 (two) times daily with a meal.   oxyCODONE-acetaminophen 5-325 MG tablet Commonly known as:  PERCOCET/ROXICET Take 1 tablet by mouth every 8 (eight) hours as needed for moderate pain. Replaces:  oxyCODONE-acetaminophen 10-325 MG tablet       The results of significant diagnostics from this hospitalization (including imaging, microbiology, ancillary and laboratory) are listed below for reference.    Significant Diagnostic Studies: Dg Chest 2 View  Result Date: 11/18/2018 CLINICAL DATA:  Cough, pneumonia. EXAM: CHEST - 2 VIEW COMPARISON:  Radiographs of November 16, 2018. FINDINGS: Stable cardiomegaly. No pneumothorax is noted.  Increased left perihilar and basilar opacity is noted consistent with pneumonia. Right lung is clear. Bony thorax is unremarkable. IMPRESSION: Increased left perihilar and basilar opacity is noted suggesting worsening pneumonia. Electronically Signed   By: Lupita Raider, M.D.   On: 11/18/2018 08:42   Dg Chest 2 View  Result Date: 11/16/2018 CLINICAL DATA:  Nonproductive cough for 5 days, hard to take a deep breath, 86% oxygen saturation on room air, history sickle cell anemia EXAM: CHEST - 2 VIEW COMPARISON:  None FINDINGS: Enlargement of cardiac silhouette with pulmonary vascular congestion consistent with sickle cell disease. Mediastinal contours normal. LEFT lower lobe consolidation consistent with pneumonia. Remaining lungs clear. No pleural effusion or pneumothorax. IMPRESSION: Enlargement of cardiac silhouette with pulmonary vascular congestion consistent with history of sickle cell disease. LEFT lower lobe  pneumonia. Electronically Signed   By: Ulyses Southward M.D.   On: 11/16/2018 16:39    Microbiology: Recent Results (from the past 240 hour(s))  Culture, blood (routine x 2)     Status: None (Preliminary result)   Collection Time: 11/16/18  4:37 PM  Result Value Ref Range Status   Specimen Description   Final    BLOOD BLOOD RIGHT FOREARM Performed at [facility], [address], [place], [place] [address]    Special Requests   Final    BOTTLES DRAWN AEROBIC AND ANAEROBIC Blood Culture adequate volume Performed at [facility], [address] [place], [place] [address]    Culture   Final    NO GROWTH 3 DAYS Performed at [facility], [address] [place], [place] [address]    Report Status PENDING  Incomplete  Culture, blood (routine x 2)     Status: None (Preliminary result)   Collection Time: 11/16/18  5:14 PM  Result Value Ref Range Status   Specimen Description BLOOD LEFT ANTECUBITAL  Final   Special Requests   Final    BOTTLES DRAWN AEROBIC AND ANAEROBIC Blood Culture results may not be optimal due to an excessive volume of blood received in culture bottles Performed at [facility], [address], [place], [place] [address]    Culture   Final    NO GROWTH 3 DAYS Performed at [facility], [address] [place], [place] [address]    Report Status PENDING  Incomplete  Respiratory Panel by PCR     Status: None   Collection Time: 11/16/18  5:14 PM  Result Value Ref Range Status   Adenovirus NOT DETECTED NOT DETECTED Final   Coronavirus 229E NOT DETECTED NOT DETECTED Final    Comment: (NOTE) The Coronavirus on the Respiratory Panel, DOES NOT test for the novel  Coronavirus (2019 nCoV)    Coronavirus HKU1 NOT DETECTED NOT DETECTED Final   Coronavirus NL63 NOT DETECTED NOT DETECTED Final   Coronavirus OC43 NOT DETECTED NOT DETECTED Final   Metapneumovirus NOT DETECTED NOT DETECTED Final   Rhinovirus /  Enterovirus NOT DETECTED NOT DETECTED Final   Influenza A NOT DETECTED NOT DETECTED Final   Influenza B NOT DETECTED NOT DETECTED Final   Parainfluenza Virus 1 NOT DETECTED NOT DETECTED Final   Parainfluenza Virus 2 NOT DETECTED NOT DETECTED Final   Parainfluenza Virus 3 NOT DETECTED NOT DETECTED Final   Parainfluenza Virus 4 NOT DETECTED NOT DETECTED Final   Respiratory Syncytial Virus NOT DETECTED NOT DETECTED Final   Bordetella pertussis NOT DETECTED NOT DETECTED Final   Chlamydophila pneumoniae NOT DETECTED  NOT DETECTED Final   Mycoplasma pneumoniae NOT DETECTED NOT DETECTED Final    Comment: Performed at [facility], [address] [place], [place] [address]  Culture, blood (routine x 2) Call MD if unable to obtain prior to antibiotics being given     Status: None (Preliminary result)   Collection Time: 11/16/18  8:00 PM  Result Value Ref Range Status   Specimen Description   Final    BLOOD LEFT ANTECUBITAL Performed at [facility], [address] [place], [place] [address]    Special Requests   Final    BOTTLES DRAWN AEROBIC AND ANAEROBIC Blood Culture results may not be optimal due to an excessive volume of blood received in culture bottles Performed at [facility], [address], [place], [place] [address]    Culture   Final    NO GROWTH 3 DAYS Performed at [facility], [address], [place], [place] [address]    Report Status PENDING  Incomplete  Urine culture     Status: None   Collection Time: 11/16/18 11:36 PM  Result Value Ref Range Status   Specimen Description   Final    URINE, RANDOM Performed at [facility], [address], [place], [place] [address]    Special Requests   Final    NONE Performed at [facility], [address], [place], [place] [address]    Culture   Final    NO GROWTH Performed at [facility], [address], [place], [place] [address]     Report Status 11/18/2018 FINAL  Final     Labs: Basic Metabolic Panel: Recent Labs  Lab 11/16/18 1714 11/17/18 0446 11/18/18 0427  NA 138 138 137  K 3.2* 3.3* 4.2  CL 103 105 105  CO2 25 25 22   GLUCOSE 93 121* 83  BUN 8 9 5*  CREATININE 0.55* 0.60* 0.40*  CALCIUM 8.4* 7.6* 8.2*   Liver Function Tests: Recent Labs  Lab 11/16/18 1714 11/18/18 0427  AST 34 36  ALT 11 16  ALKPHOS 42 41  BILITOT 11.8* 5.9*  PROT 7.9 7.7  ALBUMIN 3.9 3.6   No results for input(s): LIPASE, AMYLASE in the last 168 hours. No results for input(s): AMMONIA in the last 168 hours. CBC: Recent Labs  Lab 11/16/18 1714 11/17/18 0446 11/18/18 0427 11/19/18 0400 11/20/18 0150  WBC 19.5* 21.4* 21.3* 19.5* 16.2*  NEUTROABS 14.5* 14.3* 14.9*  --   --   HGB 6.6* 6.6* 7.0* 6.0* 7.2*  HCT 19.1* 19.3* 21.2* 17.4* 20.5*  MCV 86.8 86.9 91.0 88.8 88.0  PLT 435* 382 432* 409* 472*   Cardiac Enzymes: No results for input(s): CKTOTAL, CKMB, CKMBINDEX, TROPONINI in the last 168 hours. BNP: Invalid input(s): POCBNP CBG: No results for input(s): GLUCAP in the last 168 hours.  Time coordinating discharge: 50 minutes  Signed:  Nolon Nations  APRN, MSN, FNP-C [facility] [address] [place], [place] [id number] [phone]  [facility] 11/20/2018, 6:21 AM

## 2018-11-20 NOTE — Discharge Instructions (Addendum)
Your hemoglobin is improved to 7.1, you will need to follow-up in primary care for a repeat hemoglobin level in 1 week.  I will call the patient care center to schedule an appointment this a.m.  You will also need a repeat chest x-ray in 4 weeks to ensure that pneumonia has resolved.  I have sent Percocet 5-325 mg every 6 hours to your pharmacy.  Please take medication for mild to severe pain related to sickle cell crisis.  Also, you will continue doxycycline 100 mg every 12 hours for an additional 4 days to complete treatment for community-acquired pneumonia..     Community-Acquired Pneumonia, Adult Pneumonia is an infection of the lungs. It causes swelling in the airways of the lungs. Mucus and fluid may also build up inside the airways. One type of pneumonia can happen while a person is in a hospital. A different type can happen when a person is not in a hospital (community-acquired pneumonia).  What are the causes?  This condition is caused by germs (viruses, bacteria, or fungi). Some types of germs can be passed from one person to another. This can happen when you breathe in droplets from the cough or sneeze of an infected person. What increases the risk? You are more likely to develop this condition if you:  Have a long-term (chronic) disease, such as: ? Chronic obstructive pulmonary disease (COPD). ? Asthma. ? Cystic fibrosis. ? Congestive heart failure. ? Diabetes. ? Kidney disease.  Have HIV.  Have sickle cell disease.  Have had your spleen removed.  Do not take good care of your teeth and mouth (poor dental hygiene).  Have a medical condition that increases the risk of breathing in droplets from your own mouth and nose.  Have a weakened body defense system (immune system).  Are a smoker.  Travel to areas where the germs that cause this illness are common.  Are around certain animals or the places they live. What are the signs or symptoms?  A dry cough.  A wet  (productive) cough.  Fever.  Sweating.  Chest pain. This often happens when breathing deeply or coughing.  Fast breathing or trouble breathing.  Shortness of breath.  Shaking chills.  Feeling tired (fatigue).  Muscle aches. How is this treated? Treatment for this condition depends on many things. Most adults can be treated at home. In some cases, treatment must happen in a hospital. Treatment may include:  Medicines given by mouth or through an IV tube.  Being given extra oxygen.  Respiratory therapy. In rare cases, treatment for very bad pneumonia may include:  Using a machine to help you breathe.  Having a procedure to remove fluid from around your lungs. Follow these instructions at home: Medicines  Take over-the-counter and prescription medicines only as told by your doctor. ? Only take cough medicine if you are losing sleep.  If you were prescribed an antibiotic medicine, take it as told by your doctor. Do not stop taking the antibiotic even if you start to feel better. General instructions   Sleep with your head and neck raised (elevated). You can do this by sleeping in a recliner or by putting a few pillows under your head.  Rest as needed. Get at least 8 hours of sleep each night.  Drink enough water to keep your pee (urine) pale yellow.  Eat a healthy diet that includes plenty of vegetables, fruits, whole grains, low-fat dairy products, and lean protein.  Do not use any products that contain  nicotine or tobacco. These include cigarettes, e-cigarettes, and chewing tobacco. If you need help quitting, ask your doctor.  Keep all follow-up visits as told by your doctor. This is important. How is this prevented? A shot (vaccine) can help prevent pneumonia. Shots are often suggested for:  People older than 20 years of age.  People older than 20 years of age who: ? Are having cancer treatment. ? Have long-term (chronic) lung disease. ? Have problems with  their body's defense system. You may also prevent pneumonia if you take these actions:  Get the flu (influenza) shot every year.  Go to the dentist as often as told.  Wash your hands often. If you cannot use soap and water, use hand sanitizer. Contact a doctor if:  You have a fever.  You lose sleep because your cough medicine does not help. Get help right away if:  You are short of breath and it gets worse.  You have more chest pain.  Your sickness gets worse. This is very serious if: ? You are an older adult. ? Your body's defense system is weak.  You cough up blood. Summary  Pneumonia is an infection of the lungs.  Most adults can be treated at home. Some will need treatment in a hospital.  Drink enough water to keep your pee pale yellow.  Get at least 8 hours of sleep each night. This information is not intended to replace advice given to you by your health care provider. Make sure you discuss any questions you have with your health care provider. Document Released: 03/06/2008 Document Revised: 05/16/2018 Document Reviewed: 05/16/2018 Elsevier Interactive Patient Education  2019 [employer].

## 2018-11-21 LAB — CULTURE, BLOOD (ROUTINE X 2)
CULTURE: NO GROWTH
Culture: NO GROWTH
Culture: NO GROWTH
Special Requests: ADEQUATE

## 2019-04-29 ENCOUNTER — Encounter: Payer: Self-pay | Admitting: Family Medicine

## 2019-04-29 ENCOUNTER — Ambulatory Visit (INDEPENDENT_AMBULATORY_CARE_PROVIDER_SITE_OTHER): Payer: Medicaid Other | Admitting: Family Medicine

## 2019-04-29 ENCOUNTER — Other Ambulatory Visit: Payer: Self-pay

## 2019-04-29 ENCOUNTER — Ambulatory Visit: Payer: Medicaid Other | Admitting: Family Medicine

## 2019-04-29 VITALS — BP 121/67 | HR 77 | Temp 98.7°F | Resp 14 | Ht 68.0 in | Wt 158.0 lb

## 2019-04-29 DIAGNOSIS — R801 Persistent proteinuria, unspecified: Secondary | ICD-10-CM | POA: Diagnosis not present

## 2019-04-29 DIAGNOSIS — D5700 Hb-SS disease with crisis, unspecified: Secondary | ICD-10-CM

## 2019-04-29 DIAGNOSIS — Z23 Encounter for immunization: Secondary | ICD-10-CM

## 2019-04-29 LAB — POCT URINALYSIS DIPSTICK
Glucose, UA: NEGATIVE
Ketones, UA: NEGATIVE
Nitrite, UA: NEGATIVE
Protein, UA: POSITIVE — AB
Spec Grav, UA: 1.02 (ref 1.010–1.025)
Urobilinogen, UA: 4 E.U./dL — AB
pH, UA: 6 (ref 5.0–8.0)

## 2019-04-29 MED ORDER — LISINOPRIL 2.5 MG PO TABS: 2.5000 mg | ORAL_TABLET | Freq: Every day | ORAL | 1 refills | Status: DC

## 2019-04-29 MED ORDER — HYDROXYUREA 500 MG PO CAPS: 500.0000 mg | ORAL_CAPSULE | Freq: Every day | ORAL | 3 refills | Status: DC

## 2019-04-29 MED ORDER — FOLIC ACID 1 MG PO TABS: 1.0000 mg | ORAL_TABLET | Freq: Every day | ORAL | 3 refills | Status: DC

## 2019-04-29 NOTE — Progress Notes (Signed)
Acute Office Visit  Subjective:    Patient ID: William Gallegos, male    DOB: 07/11/2001, 18 y.o.   MRN: [id number]  Chief Complaint  Patient presents with  . Sickle Cell Anemia  . Medication Refill    pain meds, hydrea, folic acid, lisinopril     HPI William Gallegos, a 18 year old male with a medical history significant for sickle cell disease and proteinuria presents to establish care.  Patient has not had a PCP since transitioning from pediatrics greater than 1 year ago.  He was also followed by [facility], and has been lost to follow-up. Patient's sickle cell is generally controlled.  He does not have much pain and when he does he typically takes Tylenol or ibuprofen.  He drinks plenty of fluids.  He states that he takes folic acid and hydroxyurea on most days.  He missed his last hematology appointment and has to reschedule.  He denies smoking history or illicit drug use.  He occasionally uses oxycodone to avert sickle cell crises.  He is without pain on today.  He is up-to-date with eye exams and immunizations.  Is not sexually active.  Patient denies headache, dizziness, chest pain, dysuria, nausea, vomiting, or diarrhea.  Patient has a history of proteinuria.  He has been [facility] by [facility].  He was started on lisinopril 2.5 mg in 2017, and has continued since that time.  Patient is without any new complaints.  He follows with nephrology yearly and has an upcoming appointment in September.  Past Medical History:  Diagnosis Date  . Acute chest syndrome (HCC)   . Seasonal allergies   . Sickle cell anemia (HCC)     Past Surgical History:  Procedure Laterality Date  . adnoidectomy    . CHOLECYSTECTOMY    . TONSILLECTOMY AND ADENOIDECTOMY      Family History  Problem Relation Age of Onset  . Hypertension Other   . Diabetes Other   . Cancer Other   . Stroke Other   . Sickle cell anemia Brother   . Sickle cell anemia Maternal Grandfather      Social History   Socioeconomic History  . Marital status: Single    Spouse name: Not on file  . Number of children: Not on file  . Years of education: Not on file  . Highest education level: Not on file  Occupational History  . Not on file  Social Needs  . Financial resource strain: Not on file  . Food insecurity    Worry: Not on file    Inability: Not on file  . Transportation needs    Medical: Not on file    Non-medical: Not on file  Tobacco Use  . Smoking status: Never Smoker  . Smokeless tobacco: Never Used  Substance and Sexual Activity  . Alcohol use: No  . Drug use: No  . Sexual activity: Yes  Lifestyle  . Physical activity    Days per week: Not on file    Minutes per session: Not on file  . Stress: Not on file  Relationships  . Social Musicianconnections    Talks on phone: Not on file    Gets together: Not on file    Attends religious service: Not on file    Active member of club or organization: Not on file    Attends meetings of clubs or organizations: Not on file    Relationship status: Not on file  . Intimate partner  violence    Fear of current or ex partner: Not on file    Emotionally abused: Not on file    Physically abused: Not on file    Forced sexual activity: Not on file  Other Topics Concern  . Not on file  Social History Narrative  . Not on file    Outpatient Medications Prior to Visit  Medication Sig Dispense Refill  . oxyCODONE-acetaminophen (PERCOCET/ROXICET) 5-325 MG tablet Take 1 tablet by mouth every 8 (eight) hours as needed for moderate pain. 20 tablet 0  . folic acid (FOLVITE) 1 MG tablet Take 1 mg by mouth daily.     . hydroxyurea (HYDREA) 500 MG capsule TAKE 3 CAPSULES (1,500 MG TOTAL) BY MOUTH DAILY.    Marland Kitchen lisinopril (PRINIVIL,ZESTRIL) 2.5 MG tablet Take 2.5 mg by mouth daily.  4  . doxycycline (VIBRA-TABS) 100 MG tablet Take 1 tablet (100 mg total) by mouth every 12 (twelve) hours. 8 tablet 0  . naproxen (NAPROSYN) 500 MG tablet  Take 1 tablet (500 mg total) by mouth 2 (two) times daily with a meal. (Patient not taking: Reported on 11/11/2017) 20 tablet 0   No facility-administered medications prior to visit.     Allergies  Allergen Reactions  . 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 Peanuts [Peanut Oil] Anaphylaxis and Swelling    Swelling of face   . Erythromycin Hives  . Other Swelling    Raisins--- facial swelling Mushrooms---face swelling    Review of Systems  Constitutional: Negative for chills and fever.  HENT: Negative.   Eyes: Negative.   Respiratory: Negative.  Negative for cough and hemoptysis.   Cardiovascular: Negative for chest pain.  Gastrointestinal: Negative for abdominal pain, constipation, diarrhea, nausea and vomiting.  Genitourinary: Negative.  Negative for dysuria, frequency, hematuria and urgency.  Musculoskeletal: Negative.  Negative for back pain, myalgias and neck pain.  Skin: Negative.   Neurological: Negative.   Psychiatric/Behavioral: Negative for depression and suicidal ideas.       Objective:    Physical Exam  Constitutional: He is oriented to person, place, and time. He appears well-developed and well-nourished.  HENT:  Head: Normocephalic.  Eyes: Pupils are equal, round, and reactive to light.  Neck: Normal range of motion.  Cardiovascular: Normal rate, regular rhythm, normal heart sounds and intact distal pulses.  Pulmonary/Chest: Effort normal and breath sounds normal.  Abdominal: Soft. Bowel sounds are normal.  Musculoskeletal: Normal range of motion.  Neurological: He is alert and oriented to person, place, and time. He has normal reflexes.  Skin: Skin is warm and dry.  Psychiatric: He has a normal mood and affect. His behavior is normal. Judgment and thought content normal.    BP 121/67 (BP Location: Left Arm, Patient Position: Sitting, Cuff Size: Normal)   Pulse 77   Temp 98.7 F (37.1 C) (Oral)   Resp 14   Ht 5\' 8"   (1.727 m)   Wt 158 lb (71.7 kg)   SpO2 93%   BMI 24.02 kg/m  Wt Readings from Last 3 Encounters:  04/29/19 158 lb (71.7 kg)  11/16/18 155 lb (70.3 kg)  11/11/17 150 lb (68 kg) (46 %, Z= -0.10)*   * Growth percentiles are based on CDC (Boys, 2-20 Years) data.    There are no preventive care reminders to display for this patient.  There are no preventive care reminders to display for this patient.   No results found for: TSH Lab  Results  Component Value Date   WBC 16.2 (H) 11/20/2018   HGB 7.2 (L) 11/20/2018   HCT 20.5 (L) 11/20/2018   MCV 88.0 11/20/2018   PLT 472 (H) 11/20/2018   Lab Results  Component Value Date   NA 137 11/18/2018   K 4.2 11/18/2018   CO2 22 11/18/2018   GLUCOSE 83 11/18/2018   BUN 5 (L) 11/18/2018   CREATININE 0.40 (L) 11/18/2018   BILITOT 5.9 (H) 11/18/2018   ALKPHOS 41 11/18/2018   AST 36 11/18/2018   ALT 16 11/18/2018   PROT 7.7 11/18/2018   ALBUMIN 3.6 11/18/2018   CALCIUM 8.2 (L) 11/18/2018   ANIONGAP 10 11/18/2018   No results found for: CHOL No results found for: HDL No results found for: LDLCALC No results found for: TRIG No results found for: CHOLHDL No results found for: [id number]     Assessment & Plan:   Problem List Items Addressed This Visit      Other   Sickle cell anemia (HCC) - Primary   Relevant Medications   folic acid (FOLVITE) 1 MG tablet   hydroxyurea (HYDREA) 500 MG capsule   Other Relevant Orders   Urinalysis Dipstick (Completed)   CBC with Differential   CMP and Liver   Vitamin D, 25-hydroxy   Ferritin   Reticulocytes    Other Visit Diagnoses    Persistent proteinuria       Relevant Medications   lisinopril (ZESTRIL) 2.5 MG tablet   Other Relevant Orders   Urinalysis Dipstick (Completed)   Immunization due       Relevant Orders   Tdap vaccine greater than or equal to 7yo IM (Completed)     Hb-SS disease with crisis (HCC)  Continue Hydrea daily.. Will check CBC for absolute neutrophil count and  platelets. Will also check reticulocyte count. Will consider increasing dosage. We discussed the need for good hydration, monitoring of hydration status, avoidance of heat, cold, stress, and infection triggers. We discussed the risks and benefits of Hydrea, including bone marrow suppression, the possibility of GI upset, skin ulcers, hair thinning, and teratogenicity. The patient was reminded of the need to seek medical attention of any symptoms of bleeding, anemia, or infection. Will start folic acid 1 mg daily to prevent aplastic bone marrow crises. Reschedule follow up with hematology.  Pulmonary evaluation - Patient denies severe recurrent wheezes, shortness of breath with exercise, or persistent cough. If these symptoms develop, pulmonary function tests with spirometry will be ordered, and if abnormal, plan on referral to Pulmonology for further evaluation.  Cardiac - Routine screening for pulmonary hypertension is not recommended.  Eye - High risk of proliferative retinopathy. Annual eye exam with retinal exam recommended to patient. Last eye examination 6 months ago  Immunization status - Tdap received today    - Urinalysis Dipstick - folic acid (FOLVITE) 1 MG tablet; Take 1 tablet (1 mg total) by mouth daily.  Dispense: 90 tablet; Refill: 3 - hydroxyurea (HYDREA) 500 MG capsule; Take 1 capsule (500 mg total) by mouth daily. TAKE 3 CAPSULES (1,500 MG TOTAL) BY MOUTH DAILY.  Dispense: 120 capsule; Refill: 3 - CBC with Differential - CMP and Liver - Vitamin D, 25-hydroxy - Ferritin - Reticulocytes  Persistent proteinuria - Urinalysis Dipstick - lisinopril (ZESTRIL) 2.5 MG tablet; Take 1 tablet (2.5 mg total) by mouth daily.  Dispense: 90 tablet; Refill: 1   Immunization due  - Tdap vaccine greater than or equal to 7yo IM  Meds ordered this encounter  Medications  . folic acid (FOLVITE) 1 MG tablet    Sig: Take 1 tablet (1 mg total) by mouth daily.    Dispense:  90 tablet     Refill:  3  . hydroxyurea (HYDREA) 500 MG capsule    Sig: Take 1 capsule (500 mg total) by mouth daily. TAKE 3 CAPSULES (1,500 MG TOTAL) BY MOUTH DAILY.    Dispense:  120 capsule    Refill:  3  . lisinopril (ZESTRIL) 2.5 MG tablet    Sig: Take 1 tablet (2.5 mg total) by mouth daily.    Dispense:  90 tablet    Refill:  1     Nolon NationsLachina Moore Havah Ammon  APRN, MSN, FNP-C Patient Care [facility] [address]  [place], [place] [id number] [phone]

## 2019-05-02 ENCOUNTER — Other Ambulatory Visit: Payer: Self-pay | Admitting: Family Medicine

## 2019-05-02 DIAGNOSIS — E559 Vitamin D deficiency, unspecified: Secondary | ICD-10-CM

## 2019-05-02 MED ORDER — ERGOCALCIFEROL 1.25 MG (50000 UT) PO CAPS: 50000.0000 [IU] | ORAL_CAPSULE | ORAL | 1 refills | Status: DC

## 2019-05-02 NOTE — Progress Notes (Signed)
Meds ordered this encounter  Medications   ergocalciferol (VITAMIN D2) 1.25 MG (50000 UT) capsule    Sig: Take 1 capsule (50,000 Units total) by mouth once a week.    Dispense:  12 capsule    Refill:  1    Order Specific Question:   Supervising Provider    Answer:   JEGEDE, OLUGBEMIGA E [[id number]]   Gaile Allmon Moore Noe Goyer  APRN, MSN, FNP-C Patient Care [facility] [address]  [place], [place] [address] [phone]  

## 2019-05-02 NOTE — Patient Instructions (Signed)
Anemia drepanoctica en los adultos Sickle Cell Anemia, Adult  La anemia drepanoctica es una enfermedad en la que los glbulos rojos tienen forma de una hoz. Los glbulos rojos transportan oxgeno al cuerpo. Los glbulos rojos con forma de hoz no viven tanto como los glbulos rojos normales. Esto provoca que las clulas se agrupen y bloqueen el flujo de sangre en los vasos sanguneos. Esto impide que el cuerpo obtenga suficiente oxgeno. La anemia drepanoctica causa dao en los rganos y Social research officer, government. Tambin aumenta el riesgo de infeccin. Siga estas instrucciones en su casa: Medicamentos  Use los medicamentos de venta libre y los recetados solamente como se lo haya indicado el mdico.  Si le recetaron un antibitico, tmelo como se lo haya indicado el mdico. No deje de tomar el antibitico aunque comience a sentirse mejor.  Si presenta fiebre, no tome medicamentos para bajarla de inmediato. Dgale a su mdico acerca de la fiebre. Control del dolor, la rigidez y la hinchazn  Pruebe los siguientes mtodos para Theatre stage manager dolor: ? Usar una almohadilla trmica. ? Tomar un bao con agua caliente. ? Distraerse, por ejemplo, mirando televisin. Comida y bebida  [place] suficiente lquido como para mantener el pis (orina) claro o de color amarillo plido. Beba ms cuando hace calor y durante el ejercicio.  Limite o evite el alcohol.  Siga una dieta saludable. Consuma gran cantidad de frutas, verduras, cereales integrales y protenas magras.  Tome vitaminas y suplementos como se lo haya indicado el mdico. Viajes  Cuando viaje, tenga a mano la siguiente informacin: ? Su informacin mdica. ? Los nombres de sus mdicos. ? Los medicamentos que toma.  Si tiene que viajar en avin, consulte primero a su mdico. Actividad  Descanse con frecuencia.  Evite los ejercicios que hagan que su corazn lata mucho ms rpidamente, por ejemplo, trotar. Instrucciones generales  No consuma productos que  tengan nicotina o tabaco, como cigarrillos y cigarrillos electrnicos. Si necesita ayuda para dejar de fumar, consulte al [employer].  Considere usar una pulsera de alerta mdica.  Evite ir a lugares de altura (altitud alta), como las montaas.  Evite las temperaturas [place].  Evite los lugares donde los cambios de temperatura sean bruscos.  Concurra a todas visitas de seguimiento como se lo haya indicado el mdico. Esto es importante. Comunquese con un mdico si:  Le duele alguna articulacin.  Le duelen o se le hinchan los pies o las manos.  Siente cansancio (fatiga). Solicite ayuda inmediatamente si:  Tiene sntomas de infeccin. Esto incluye lo siguiente: ? [place]. ? Escalofros. ? Mucho cansancio. ? Irritabilidad. ? Falta de apetito. ? Vmitos.  Se siente mareado o se desmaya.  Tiene dolor de estmago, especialmente en la zona izquierda.  Tiene una ereccin (priapismo) que dura ms de 4 horas.  Siente adormecimiento en los brazos o en las piernas.  Tiene dificultad para mover los brazos o las piernas.  Tiene dificultad para hablar.  Tiene dolor que no desaparece despus de Teacher, adult education.  Le falta el aire.  Respira rpidamente.  Tiene tos que dura [employer].  Siente dolor en el pecho.  Tiene dolor de Control and instrumentation engineer.  Presenta rigidez en el cuello.  Tiene el estmago hinchado aunque no comi mucho.  Tiene la piel plida.  Repentinamente no puede ver bien. Resumen  La anemia drepanoctica es una enfermedad en la que los glbulos rojos tienen forma de una hoz.  Siga el consejo de su mdico [employer] formas de Glass blower/designer,  los alimentos que Tech Data Corporationdebe comer, las actividades que puede Education officer, environmentalrealizar y los pasos que debe seguir para viajar de forma segura.  Busque asistencia mdica de inmediato si tiene signos de infeccin, como la [place]. Esta informacin no tiene Theme park managercomo fin reemplazar el consejo del mdico. Asegrese de hacerle al mdico  cualquier pregunta que tenga. Document Released: 07/09/2013 Document Revised: 01/29/2019 Document Reviewed: 03/15/2017 Elsevier Patient Education  2020 [employer]

## 2019-05-23 ENCOUNTER — Encounter (HOSPITAL_COMMUNITY): Payer: Self-pay

## 2019-07-08 ENCOUNTER — Other Ambulatory Visit: Payer: Medicaid Other

## 2019-08-05 ENCOUNTER — Encounter: Payer: Self-pay | Admitting: Family Medicine

## 2019-08-05 ENCOUNTER — Other Ambulatory Visit: Payer: Self-pay

## 2019-08-05 ENCOUNTER — Ambulatory Visit (INDEPENDENT_AMBULATORY_CARE_PROVIDER_SITE_OTHER): Payer: Medicaid Other | Admitting: Family Medicine

## 2019-08-05 VITALS — BP 130/59 | HR 82 | Temp 98.8°F | Resp 14 | Ht 68.0 in | Wt 161.0 lb

## 2019-08-05 DIAGNOSIS — Z23 Encounter for immunization: Secondary | ICD-10-CM

## 2019-08-05 DIAGNOSIS — D5700 Hb-SS disease with crisis, unspecified: Secondary | ICD-10-CM | POA: Diagnosis not present

## 2019-08-05 DIAGNOSIS — R801 Persistent proteinuria, unspecified: Secondary | ICD-10-CM

## 2019-08-05 DIAGNOSIS — D571 Sickle-cell disease without crisis: Secondary | ICD-10-CM

## 2019-08-05 LAB — POCT URINALYSIS DIPSTICK
Bilirubin, UA: NEGATIVE
Glucose, UA: NEGATIVE
Ketones, UA: NEGATIVE
Leukocytes, UA: NEGATIVE
Nitrite, UA: NEGATIVE
Protein, UA: POSITIVE — AB
Spec Grav, UA: 1.02 (ref 1.010–1.025)
Urobilinogen, UA: 1 E.U./dL
pH, UA: 7 (ref 5.0–8.0)

## 2019-08-05 MED ORDER — LISINOPRIL 2.5 MG PO TABS: 2.5000 mg | ORAL_TABLET | Freq: Every day | ORAL | 1 refills | Status: DC

## 2019-08-05 MED ORDER — FOLIC ACID 1 MG PO TABS: 1.0000 mg | ORAL_TABLET | Freq: Every day | ORAL | 3 refills | Status: DC

## 2019-08-05 MED ORDER — HYDROXYUREA 500 MG PO CAPS: 500.0000 mg | ORAL_CAPSULE | Freq: Every day | ORAL | 3 refills | Status: DC

## 2019-08-05 NOTE — Patient Instructions (Signed)
Sickle Cell Anemia, Adult ° °Sickle cell anemia is a condition where your red blood cells are shaped like sickles. Red blood cells carry oxygen through the body. Sickle-shaped cells do not live as long as normal red blood cells. They also clump together and block blood from flowing through the blood vessels. This prevents the body from getting enough oxygen. Sickle cell anemia causes organ damage and pain. It also increases the risk of infection. °Follow these instructions at home: °Medicines °· Take over-the-counter and prescription medicines only as told by your doctor. °· If you were prescribed an antibiotic medicine, take it as told by your doctor. Do not stop taking the antibiotic even if you start to feel better. °· If you develop a fever, do not take medicines to lower the fever right away. Tell your doctor about the fever. °Managing pain, stiffness, and swelling °· Try these methods to help with pain: °? Use a heating pad. °? Take a warm bath. °? Distract yourself, such as by watching TV. °Eating and drinking °· Drink enough fluid to keep your pee (urine) clear or pale yellow. Drink more in hot weather and during exercise. °· Limit or avoid alcohol. °· Eat a healthy diet. Eat plenty of fruits, vegetables, whole grains, and lean protein. °· Take vitamins and supplements as told by your doctor. °Traveling °· When traveling, keep these with you: °? Your medical information. °? The names of your doctors. °? Your medicines. °· If you need to take an airplane, talk to your doctor first. °Activity °· Rest often. °· Avoid exercises that make your heart beat much faster, such as jogging. °General instructions °· Do not use products that have nicotine or tobacco, such as cigarettes and e-cigarettes. If you need help quitting, ask your doctor. °· Consider wearing a medical alert bracelet. °· Avoid being in high places (high altitudes), such as mountains. °· Avoid very hot or cold temperatures. °· Avoid places where the  temperature changes a lot. °· Keep all follow-up visits as told by your doctor. This is important. °Contact a doctor if: °· A joint hurts. °· Your feet or hands hurt or swell. °· You feel tired (fatigued). °Get help right away if: °· You have symptoms of infection. These include: °? Fever. °? Chills. °? Being very tired. °? Irritability. °? Poor eating. °? Throwing up (vomiting). °· You feel dizzy or faint. °· You have new stomach pain, especially on the left side. °· You have a an erection (priapism) that lasts more than 4 hours. °· You have numbness in your arms or legs. °· You have a hard time moving your arms or legs. °· You have trouble talking. °· You have pain that does not go away when you take medicine. °· You are short of breath. °· You are breathing fast. °· You have a long-term cough. °· You have pain in your chest. °· You have a bad headache. °· You have a stiff neck. °· Your stomach looks bloated even though you did not eat much. °· Your skin is pale. °· You suddenly cannot see well. °Summary °· Sickle cell anemia is a condition where your red blood cells are shaped like sickles. °· Follow your doctor's advice on ways to manage pain, food to eat, activities to do, and steps to take for safe travel. °· Get medical help right away if you have any signs of infection, such as a fever. °This information is not intended to replace advice given to you by   your health care provider. Make sure you discuss any questions you have with your health care provider. °Document Released: 07/09/2013 Document Revised: 01/10/2019 Document Reviewed: 10/24/2016 °Elsevier Patient Education © 2020 Elsevier Inc. ° °

## 2019-08-06 ENCOUNTER — Telehealth: Payer: Self-pay

## 2019-08-06 ENCOUNTER — Other Ambulatory Visit: Payer: Self-pay | Admitting: Family Medicine

## 2019-08-06 DIAGNOSIS — E559 Vitamin D deficiency, unspecified: Secondary | ICD-10-CM

## 2019-08-06 LAB — COMPREHENSIVE METABOLIC PANEL
ALT: 7 IU/L (ref 0–44)
AST: 27 IU/L (ref 0–40)
Albumin/Globulin Ratio: 1.4 (ref 1.2–2.2)
Albumin: 4 g/dL — ABNORMAL LOW (ref 4.1–5.2)
Alkaline Phosphatase: 48 IU/L (ref 39–117)
BUN/Creatinine Ratio: 7 — ABNORMAL LOW (ref 9–20)
BUN: 4 mg/dL — ABNORMAL LOW (ref 6–20)
Bilirubin Total: 5.2 mg/dL — ABNORMAL HIGH (ref 0.0–1.2)
CO2: 20 mmol/L (ref 20–29)
Calcium: 8.8 mg/dL (ref 8.7–10.2)
Chloride: 105 mmol/L (ref 96–106)
Creatinine, Ser: 0.6 mg/dL — ABNORMAL LOW (ref 0.76–1.27)
GFR calc Af Amer: 167 mL/min/{1.73_m2} (ref >59)
GFR calc non Af Amer: 145 mL/min/{1.73_m2} (ref >59)
Globulin, Total: 2.9 g/dL (ref 1.5–4.5)
Glucose: 87 mg/dL (ref 65–99)
Potassium: 4 mmol/L (ref 3.5–5.2)
Sodium: 139 mmol/L (ref 134–144)
Total Protein: 6.9 g/dL (ref 6.0–8.5)

## 2019-08-06 LAB — RETICULOCYTES: Retic Ct Pct: 9.6 % — ABNORMAL HIGH (ref 0.6–2.6)

## 2019-08-06 LAB — CBC WITH DIFFERENTIAL/PLATELET
Basophils Absolute: 0.1 10*3/uL (ref 0.0–0.2)
Basos: 1 %
EOS (ABSOLUTE): 0.4 10*3/uL (ref 0.0–0.4)
Eos: 4 %
Hematocrit: 24.1 % — ABNORMAL LOW (ref 37.5–51.0)
Hemoglobin: 8.1 g/dL — ABNORMAL LOW (ref 13.0–17.7)
Immature Grans (Abs): 0 10*3/uL (ref 0.0–0.1)
Immature Granulocytes: 0 %
Lymphocytes Absolute: 5.1 10*3/uL — ABNORMAL HIGH (ref 0.7–3.1)
Lymphs: 51 %
MCH: 28.9 pg (ref 26.6–33.0)
MCHC: 33.6 g/dL (ref 31.5–35.7)
MCV: 86 fL (ref 79–97)
Monocytes Absolute: 0.6 10*3/uL (ref 0.1–0.9)
Monocytes: 6 %
NRBC: 2 % — ABNORMAL HIGH (ref 0–0)
Neutrophils Absolute: 3.8 10*3/uL (ref 1.4–7.0)
Neutrophils: 38 %
Platelets: 435 10*3/uL (ref 150–450)
RBC: 2.8 x10E6/uL — ABNORMAL LOW (ref 4.14–5.80)
RDW: 24.9 % — ABNORMAL HIGH (ref 11.6–15.4)
WBC: 10 10*3/uL (ref 3.4–10.8)

## 2019-08-06 LAB — VITAMIN D 25 HYDROXY (VIT D DEFICIENCY, FRACTURES): Vit D, 25-Hydroxy: 13.5 ng/mL — ABNORMAL LOW (ref 30.0–100.0)

## 2019-08-06 MED ORDER — ERGOCALCIFEROL 1.25 MG (50000 UT) PO CAPS: 50000.0000 [IU] | ORAL_CAPSULE | ORAL | 1 refills | Status: DC

## 2019-08-06 NOTE — Telephone Encounter (Signed)
-----   Message from Dorena Dew, [place] sent at 08/06/2019  4:21 PM EST ----- Regarding: lab results Please inform patient that vitamin D levels are low.  Will send Drisdol 50,000 units to pharmacy.  Patient is to take medication weekly.  Also, recommend vitamin D fortified foods such as soy milk, liver, tuna, sardines, cheese, etc.  Also, hemoglobin is 8.1.  There is no clinical indication for blood transfusion at this time.  Continue folic acid 1 mg daily for bone marrow support.  Also, continue hydroxyurea.  Follow-up in office as scheduled. Donia Pounds  APRN, MSN, FNP-C Patient [facility] [address] [place], [place] [address] [phone]

## 2019-08-06 NOTE — Telephone Encounter (Signed)
Called and spoke with patient, advised that vitamin D is low and to take once weekly vitamin D as directed. Asked that he eat more vitamin D rich foods and gave examples. Advised that hgb was 8.1 and that no need for blood transfusion at this time. Asked to continue folic acid and hydroxyurea and to keep next appointment. Thanks!

## 2019-08-06 NOTE — Progress Notes (Signed)
Meds ordered this encounter  Medications   ergocalciferol (VITAMIN D2) 1.25 MG (50000 UT) capsule    Sig: Take 1 capsule (50,000 Units total) by mouth once a week.    Dispense:  12 capsule    Refill:  1    Order Specific Question:   Supervising Provider    Answer:   JEGEDE, OLUGBEMIGA E [[id number]]   Ishaq Maffei Moore Padraig Nhan  APRN, MSN, FNP-C Patient Care [facility] [address]  [place], [place] [address] [phone]  

## 2019-08-10 NOTE — Progress Notes (Signed)
Acute Office Visit  Subjective:    Patient ID: William Gallegos, male    DOB: 05/02/2001, 18 y.o.   MRN: [id number]  Chief Complaint  Patient presents with  . Sickle Cell Anemia    HPI William Gallegos, a 18 year old male with a medical history significant for sickle cell disease and proteinuria presents for a 3 month follow up of sickle cell anemia. Patient has been well and is without complaint on today.   Patient's sickle cell is generally controlled.  He does not have much pain and when he does he typically takes Tylenol or ibuprofen.  He drinks plenty of fluids.  He states that he takes folic acid and hydroxyurea on most days.  Marland Kitchen  He denies smoking history or illicit drug use.  He occasionally uses oxycodone to avert sickle cell crises.  He is without pain on today.  He is up-to-date with eye exams and immunizations.  Is not sexually active.  Patient denies headache, dizziness, chest pain, dysuria, nausea, vomiting, or diarrhea.  Patient has a history of proteinuria.  He has been seen by [facility] pediatric nephrology.  He was started on lisinopril 2.5 mg in 2017, and has continued since that time.  Patient is without any new complaints. He has been taking lisinopril without interruption.   Past Medical History:  Diagnosis Date  . Acute chest syndrome (HCC)   . Seasonal allergies   . Sickle cell anemia (HCC)     Past Surgical History:  Procedure Laterality Date  . adnoidectomy    . CHOLECYSTECTOMY    . TONSILLECTOMY AND ADENOIDECTOMY      Family History  Problem Relation Age of Onset  . Hypertension Other   . Diabetes Other   . Cancer Other   . Stroke Other   . Sickle cell anemia Brother   . Sickle cell anemia Maternal Grandfather     Social History   Socioeconomic History  . Marital status: Single    Spouse name: Not on file  . Number of children: Not on file  . Years of education: Not on file  . Highest education level: Not on file  Occupational History  .  Not on file  Social Needs  . Financial resource strain: Not on file  . Food insecurity    Worry: Not on file    Inability: Not on file  . Transportation needs    Medical: Not on file    Non-medical: Not on file  Tobacco Use  . Smoking status: Never Smoker  . Smokeless tobacco: Never Used  Substance and Sexual Activity  . Alcohol use: No  . Drug use: No  . Sexual activity: Yes  Lifestyle  . Physical activity    Days per week: Not on file    Minutes per session: Not on file  . Stress: Not on file  Relationships  . Social Musician on phone: Not on file    Gets together: Not on file    Attends religious service: Not on file    Active member of club or organization: Not on file    Attends meetings of clubs or organizations: Not on file    Relationship status: Not on file  . Intimate partner violence    Fear of current or ex partner: Not on file    Emotionally abused: Not on file    Physically abused: Not on file    Forced sexual activity: Not on file  Other  Topics Concern  . Not on file  Social History Narrative  . Not on file    Outpatient Medications Prior to Visit  Medication Sig Dispense Refill  . oxyCODONE-acetaminophen (PERCOCET/ROXICET) 5-325 MG tablet Take 1 tablet by mouth every 8 (eight) hours as needed for moderate pain. 20 tablet 0  . folic acid (FOLVITE) 1 MG tablet Take 1 tablet (1 mg total) by mouth daily. 90 tablet 3  . hydroxyurea (HYDREA) 500 MG capsule Take 1 capsule (500 mg total) by mouth daily. TAKE 3 CAPSULES (1,500 MG TOTAL) BY MOUTH DAILY. 120 capsule 3  . lisinopril (ZESTRIL) 2.5 MG tablet Take 1 tablet (2.5 mg total) by mouth daily. 90 tablet 1  . ergocalciferol (VITAMIN D2) 1.25 MG (50000 UT) capsule Take 1 capsule (50,000 Units total) by mouth once a week. (Patient not taking: Reported on 08/05/2019) 12 capsule 1   No facility-administered medications prior to visit.     Allergies  Allergen Reactions  . Carrot [Daucus Carota]  Anaphylaxis and Swelling    Swelling of face   . Peanuts [Peanut Oil] Anaphylaxis and Swelling    Swelling of face   . Erythromycin Hives  . Other Swelling    Raisins--- facial swelling Mushrooms---face swelling    Review of Systems  Constitutional: Negative for chills and fever.  HENT: Negative.   Eyes: Negative.   Respiratory: Negative.  Negative for cough and hemoptysis.   Cardiovascular: Negative for chest pain.  Gastrointestinal: Negative for abdominal pain, constipation, diarrhea, nausea and vomiting.  Genitourinary: Negative.  Negative for dysuria, frequency, hematuria and urgency.  Musculoskeletal: Negative.  Negative for back pain, myalgias and neck pain.  Skin: Negative.   Neurological: Negative.   Psychiatric/Behavioral: Negative for depression and suicidal ideas.       Objective:    Physical Exam  Constitutional: He is oriented to person, place, and time. He appears well-developed and well-nourished.  HENT:  Head: Normocephalic.  Eyes: Pupils are equal, round, and reactive to light.  Neck: Normal range of motion.  Cardiovascular: Normal rate, regular rhythm, normal heart sounds and intact distal pulses.  Pulmonary/Chest: Effort normal and breath sounds normal.  Abdominal: Soft. Bowel sounds are normal.  Musculoskeletal: Normal range of motion.  Neurological: He is alert and oriented to person, place, and time. He has normal reflexes.  Skin: Skin is warm and dry.  Psychiatric: He has a normal mood and affect. His behavior is normal. Judgment and thought content normal.    BP (!) 130/59 (BP Location: Left Arm, Patient Position: Sitting, Cuff Size: Normal)   Pulse 82   Temp 98.8 F (37.1 C) (Oral)   Resp 14   Ht 5\' 8"  (1.727 m)   Wt 161 lb (73 kg)   SpO2 93%   BMI 24.48 kg/m  Wt Readings from Last 3 Encounters:  08/05/19 161 lb (73 kg)  04/29/19 158 lb (71.7 kg)  11/16/18 155 lb (70.3 kg)    There are no preventive care reminders to display for this  patient.  There are no preventive care reminders to display for this patient.   No results found for: TSH Lab Results  Component Value Date   WBC 10.0 08/05/2019   HGB 8.1 (L) 08/05/2019   HCT 24.1 (L) 08/05/2019   MCV 86 08/05/2019   PLT 435 08/05/2019   Lab Results  Component Value Date   NA 139 08/05/2019   K 4.0 08/05/2019   CO2 20 08/05/2019   GLUCOSE 87 08/05/2019  BUN 4 (L) 08/05/2019   CREATININE 0.60 (L) 08/05/2019   BILITOT 5.2 (H) 08/05/2019   ALKPHOS 48 08/05/2019   AST 27 08/05/2019   ALT 7 08/05/2019   PROT 6.9 08/05/2019   ALBUMIN 4.0 (L) 08/05/2019   CALCIUM 8.8 08/05/2019   ANIONGAP 10 11/18/2018   No results found for: CHOL No results found for: HDL No results found for: LDLCALC No results found for: TRIG No results found for: CHOLHDL No results found for: WUJW1X     Assessment & Plan:   Problem List Items Addressed This Visit      Other   Sickle cell anemia (HCC) - Primary   Relevant Medications   hydroxyurea (HYDREA) 500 MG capsule   folic acid (FOLVITE) 1 MG tablet   Other Relevant Orders   Vitamin D, 25-hydroxy (Completed)   CBC with Differential (Completed)   Reticulocytes (Completed)   Comprehensive metabolic panel (Completed)   Urinalysis Dipstick (Completed)    Other Visit Diagnoses    Persistent proteinuria       Relevant Medications   lisinopril (ZESTRIL) 2.5 MG tablet     Hb-SS disease with crisis (HCC)  Continue Hydrea daily.. Will check CBC for absolute neutrophil count and platelets. Will also check reticulocyte count. Will consider increasing dosage. We discussed the need for good hydration, monitoring of hydration status, avoidance of heat, cold, stress, and infection triggers. We discussed the risks and benefits of Hydrea, including bone marrow suppression, the possibility of GI upset, skin ulcers, hair thinning, and teratogenicity. The patient was reminded of the need to seek medical attention of any symptoms of  bleeding, anemia, or infection. Will start folic acid 1 mg daily to prevent aplastic bone marrow crises. Reschedule follow up with hematology.  Pulmonary evaluation - Patient denies severe recurrent wheezes, shortness of breath with exercise, or persistent cough. If these symptoms develop, pulmonary function tests with spirometry will be ordered, and if abnormal, plan on referral to Pulmonology for further evaluation.  Cardiac - Routine screening for pulmonary hypertension is not recommended.  Eye - High risk of proliferative retinopathy. Annual eye exam with retinal exam recommended to patient. Last eye examination 6 months ago  Immunization status - Influenza vaccination received - Urinalysis Dipstick  - Vitamin D, 25-hydroxy - CBC with Differential - Reticulocytes - Comprehensive metabolic panel - hydroxyurea (HYDREA) 500 MG capsule; Take 1 capsule (500 mg total) by mouth daily. TAKE 3 CAPSULES (1,500 MG TOTAL) BY MOUTH DAILY.  Dispense: 120 capsule; Refill: 3 - folic acid (FOLVITE) 1 MG tablet; Take 1 tablet (1 mg total) by mouth daily.  Dispense: 90 tablet; Refill: 3   Persistent proteinuria - lisinopril (ZESTRIL) 2.5 MG tablet; Take 1 tablet (2.5 mg total) by mouth daily.  Dispense: 90 tablet; Refill: 1  Meds ordered this encounter  Medications  . hydroxyurea (HYDREA) 500 MG capsule    Sig: Take 1 capsule (500 mg total) by mouth daily. TAKE 3 CAPSULES (1,500 MG TOTAL) BY MOUTH DAILY.    Dispense:  120 capsule    Refill:  3    Order Specific Question:   Supervising Provider    Answer:   Quentin Angst [id number]  . lisinopril (ZESTRIL) 2.5 MG tablet    Sig: Take 1 tablet (2.5 mg total) by mouth daily.    Dispense:  90 tablet    Refill:  1    Order Specific Question:   Supervising Provider    Answer:   Quentin Angst [id number]  .  folic acid (FOLVITE) 1 MG tablet    Sig: Take 1 tablet (1 mg total) by mouth daily.    Dispense:  90 tablet    Refill:  3    Order  Specific Question:   Supervising Provider    Answer:   Quentin AngstJEGEDE, OLUGBEMIGA E [[id number]]     Nolon NationsLachina Moore Atianna Haidar  APRN, MSN, FNP-C Patient Care [facility] [address]  [place], [place] [id number] [phone]  This note was prepared using Dragon speech recognition software, errors in dictation are unintentional.

## 2019-08-17 ENCOUNTER — Emergency Department (HOSPITAL_COMMUNITY): Payer: Medicaid Other

## 2019-08-17 ENCOUNTER — Other Ambulatory Visit: Payer: Self-pay

## 2019-08-17 ENCOUNTER — Encounter (HOSPITAL_COMMUNITY): Payer: Self-pay

## 2019-08-17 ENCOUNTER — Inpatient Hospital Stay (HOSPITAL_COMMUNITY)
Admission: EM | Admit: 2019-08-17 | Discharge: 2019-08-19 | DRG: 812 | Disposition: A | Discharge status: Home / Self Care | Payer: Medicaid Other | Attending: Internal Medicine | Admitting: Internal Medicine

## 2019-08-17 DIAGNOSIS — Z20828 Contact with and (suspected) exposure to other viral communicable diseases: Secondary | ICD-10-CM | POA: Diagnosis present

## 2019-08-17 DIAGNOSIS — Z832 Family history of diseases of the blood and blood-forming organs and certain disorders involving the immune mechanism: Secondary | ICD-10-CM

## 2019-08-17 DIAGNOSIS — D72829 Elevated white blood cell count, unspecified: Secondary | ICD-10-CM | POA: Diagnosis present

## 2019-08-17 DIAGNOSIS — R509 Fever, unspecified: Secondary | ICD-10-CM | POA: Diagnosis present

## 2019-08-17 DIAGNOSIS — Z79899 Other long term (current) drug therapy: Secondary | ICD-10-CM | POA: Diagnosis not present

## 2019-08-17 DIAGNOSIS — Z91018 Allergy to other foods: Secondary | ICD-10-CM

## 2019-08-17 DIAGNOSIS — Z823 Family history of stroke: Secondary | ICD-10-CM | POA: Diagnosis not present

## 2019-08-17 DIAGNOSIS — Z9049 Acquired absence of other specified parts of digestive tract: Secondary | ICD-10-CM

## 2019-08-17 DIAGNOSIS — Z881 Allergy status to other antibiotic agents status: Secondary | ICD-10-CM | POA: Diagnosis not present

## 2019-08-17 DIAGNOSIS — I10 Essential (primary) hypertension: Secondary | ICD-10-CM | POA: Diagnosis present

## 2019-08-17 DIAGNOSIS — Z8249 Family history of ischemic heart disease and other diseases of the circulatory system: Secondary | ICD-10-CM

## 2019-08-17 DIAGNOSIS — Z833 Family history of diabetes mellitus: Secondary | ICD-10-CM | POA: Diagnosis not present

## 2019-08-17 DIAGNOSIS — Z23 Encounter for immunization: Secondary | ICD-10-CM | POA: Diagnosis not present

## 2019-08-17 DIAGNOSIS — Z79891 Long term (current) use of opiate analgesic: Secondary | ICD-10-CM

## 2019-08-17 DIAGNOSIS — J302 Other seasonal allergic rhinitis: Secondary | ICD-10-CM | POA: Diagnosis present

## 2019-08-17 DIAGNOSIS — D5700 Hb-SS disease with crisis, unspecified: Secondary | ICD-10-CM | POA: Diagnosis present

## 2019-08-17 DIAGNOSIS — Z888 Allergy status to other drugs, medicaments and biological substances status: Secondary | ICD-10-CM

## 2019-08-17 LAB — URINALYSIS, ROUTINE W REFLEX MICROSCOPIC
Bacteria, UA: NONE SEEN
Bilirubin Urine: NEGATIVE
Glucose, UA: NEGATIVE mg/dL
Ketones, ur: NEGATIVE mg/dL
Leukocytes,Ua: NEGATIVE
Nitrite: NEGATIVE
Protein, ur: 100 mg/dL — AB
Specific Gravity, Urine: 1.009 (ref 1.005–1.030)
pH: 7 (ref 5.0–8.0)

## 2019-08-17 LAB — COMPREHENSIVE METABOLIC PANEL
ALT: 20 U/L (ref 0–44)
AST: 82 U/L — ABNORMAL HIGH (ref 15–41)
Albumin: 4.1 g/dL (ref 3.5–5.0)
Alkaline Phosphatase: 48 U/L (ref 38–126)
Anion gap: 6 (ref 5–15)
BUN: 7 mg/dL (ref 6–20)
CO2: 22 mmol/L (ref 22–32)
Calcium: 8.4 mg/dL — ABNORMAL LOW (ref 8.9–10.3)
Chloride: 110 mmol/L (ref 98–111)
Creatinine, Ser: 0.5 mg/dL — ABNORMAL LOW (ref 0.61–1.24)
GFR calc Af Amer: >60 mL/min (ref >60)
GFR calc non Af Amer: >60 mL/min (ref >60)
Glucose, Bld: 114 mg/dL — ABNORMAL HIGH (ref 70–99)
Potassium: 3.9 mmol/L (ref 3.5–5.1)
Sodium: 138 mmol/L (ref 135–145)
Total Bilirubin: 4.9 mg/dL — ABNORMAL HIGH (ref 0.3–1.2)
Total Protein: 7.6 g/dL (ref 6.5–8.1)

## 2019-08-17 LAB — CBC
HCT: 22.5 % — ABNORMAL LOW (ref 39.0–52.0)
Hemoglobin: 8 g/dL — ABNORMAL LOW (ref 13.0–17.0)
MCH: 30.4 pg (ref 26.0–34.0)
MCHC: 35.6 g/dL (ref 30.0–36.0)
MCV: 85.6 fL (ref 80.0–100.0)
Platelets: 301 10*3/uL (ref 150–400)
RBC: 2.63 MIL/uL — ABNORMAL LOW (ref 4.22–5.81)
RDW: 25.4 % — ABNORMAL HIGH (ref 11.5–15.5)
WBC: 25 10*3/uL — ABNORMAL HIGH (ref 4.0–10.5)
nRBC: 2.9 % — ABNORMAL HIGH (ref 0.0–0.2)

## 2019-08-17 LAB — LACTIC ACID, PLASMA
Lactic Acid, Venous: 1 mmol/L (ref 0.5–1.9)
Lactic Acid, Venous: 0.8 mmol/L (ref 0.5–1.9)

## 2019-08-17 LAB — PROTIME-INR
INR: 1.7 — ABNORMAL HIGH (ref 0.8–1.2)
Prothrombin Time: 19.4 seconds — ABNORMAL HIGH (ref 11.4–15.2)

## 2019-08-17 LAB — RETICULOCYTES
Immature Retic Fract: 37.8 % — ABNORMAL HIGH (ref 2.3–15.9)
RBC.: 2.63 MIL/uL — ABNORMAL LOW (ref 4.22–5.81)
Retic Count, Absolute: 208.3 10*3/uL — ABNORMAL HIGH (ref 19.0–186.0)
Retic Ct Pct: 7.9 % — ABNORMAL HIGH (ref 0.4–3.1)

## 2019-08-17 LAB — PROCALCITONIN: Procalcitonin: 7.35 ng/mL

## 2019-08-17 LAB — APTT: aPTT: 38 seconds — ABNORMAL HIGH (ref 24–36)

## 2019-08-17 MED ORDER — SODIUM CHLORIDE 0.9 % IV BOLUS
500.0000 mL | Freq: Once | INTRAVENOUS | Status: AC
  Administered 2019-08-17: 17:00:00 500 mL via INTRAVENOUS

## 2019-08-17 MED ORDER — HYDROMORPHONE HCL 1 MG/ML IJ SOLN
1.0000 mg | Freq: Once | INTRAMUSCULAR | Status: AC
  Administered 2019-08-17: 1 mg via INTRAVENOUS
  Filled 2019-08-17: qty 1

## 2019-08-17 MED ORDER — ACETAMINOPHEN 500 MG PO TABS
1000.0000 mg | ORAL_TABLET | Freq: Once | ORAL | Status: AC
  Administered 2019-08-17: 1000 mg via ORAL
  Filled 2019-08-17: qty 2

## 2019-08-17 MED ORDER — HYDROMORPHONE HCL 1 MG/ML IJ SOLN
1.0000 mg | Freq: Once | INTRAMUSCULAR | Status: AC
  Administered 2019-08-17: 17:00:00 1 mg via INTRAVENOUS
  Filled 2019-08-17: qty 1

## 2019-08-17 MED ORDER — KETOROLAC TROMETHAMINE 30 MG/ML IJ SOLN
30.0000 mg | Freq: Once | INTRAMUSCULAR | Status: AC
  Administered 2019-08-17: 30 mg via INTRAVENOUS
  Filled 2019-08-17: qty 1

## 2019-08-17 MED ORDER — SODIUM CHLORIDE 0.9 % IV SOLN
1.0000 g | Freq: Once | INTRAVENOUS | Status: AC
  Administered 2019-08-17: 1 g via INTRAVENOUS
  Filled 2019-08-17: qty 10

## 2019-08-17 NOTE — ED Provider Notes (Addendum)
[facility]-EMERGENCY DEPT Provider Note   CSN: [id number] Arrival date & time: 08/17/19  1653     History   Chief Complaint Chief Complaint  Patient presents with   Sickle Cell Pain Crisis    HPI William Gallegos is a 18 y.o. male.     Patient with hx sickle cell anemia c/o back pain that feels like his sickle cell//crisis related pain. Symptoms acute onset in past day, constant, dull, severe, non radiating. No trauma or injury to back. No radicular pain. No saddle area or leg numbness or weakness. Pt denies fever/chills at home, although EMS noted pt febrile.  Denies abd pain. +nausea. No chest pain or discomfort. No sob. No cough or uri symptoms. No known covid + exposure. States  Tried his home meds for pain today, but vomited shortly after - that episode of vomiting not bloody or bilious. EMS gave pain and nausea meds with some improvement in his pain and nausea.   The history is provided by the patient and the EMS personnel.  Fever Associated symptoms: no chest pain, no chills, no confusion, no headaches, no rash and no sore throat   Sickle Cell Pain Crisis Associated symptoms: fever   Associated symptoms: no chest pain, no headaches, no shortness of breath and no sore throat     Past Medical History:  Diagnosis Date   Acute chest syndrome (HCC)    Seasonal allergies    Sickle cell anemia (HCC)     Patient Active Problem List   Diagnosis Date Noted   Acute chest syndrome in sickle crisis (HCC)    Anemia    Cough    PNA (pneumonia) 11/16/2018   Other proteinuria    Leucocytosis 11/11/2017   SIRS (systemic inflammatory response syndrome) (HCC) 05/09/2017   Iron overload due to repeated red blood cell transfusions 01/21/2017   Hx of transfusion of packed red blood cells 01/21/2017   Sickle cell anemia (HCC) 06/13/2015   Fever     Past Surgical History:  Procedure Laterality Date   adnoidectomy     CHOLECYSTECTOMY      TONSILLECTOMY AND ADENOIDECTOMY          Home Medications    Prior to Admission medications   Medication Sig Start Date End Date Taking? Authorizing Provider  ergocalciferol (VITAMIN D2) 1.25 MG (50000 UT) capsule Take 1 capsule (50,000 Units total) by mouth once a week. 08/06/19   Massie Maroon, FNP  folic acid (FOLVITE) 1 MG tablet Take 1 tablet (1 mg total) by mouth daily. 08/05/19   Massie Maroon, FNP  hydroxyurea (HYDREA) 500 MG capsule Take 1 capsule (500 mg total) by mouth daily. TAKE 3 CAPSULES (1,500 MG TOTAL) BY MOUTH DAILY. 08/05/19   Massie Maroon, FNP  lisinopril (ZESTRIL) 2.5 MG tablet Take 1 tablet (2.5 mg total) by mouth daily. 08/05/19   Massie Maroon, FNP  oxyCODONE-acetaminophen (PERCOCET/ROXICET) 5-325 MG tablet Take 1 tablet by mouth every 8 (eight) hours as needed for moderate pain. 11/20/18   Massie Maroon, FNP    Family History Family History  Problem Relation Age of Onset   Hypertension Other    Diabetes Other    Cancer Other    Stroke Other    Sickle cell anemia Brother    Sickle cell anemia Maternal Grandfather     Social History Social History   Tobacco Use   Smoking status: Never Smoker   Smokeless tobacco: Never Used  Substance Use  Topics   Alcohol use: No   Drug use: No     Allergies   Carrot [daucus carota], Peanuts [peanut oil], Erythromycin, and Other   Review of Systems Review of Systems  Constitutional: Positive for fever. Negative for chills and diaphoresis.  HENT: Negative for sore throat.   Eyes: Negative for redness.  Respiratory: Negative for shortness of breath.   Cardiovascular: Negative for chest pain.  Gastrointestinal: Negative for abdominal pain.  Genitourinary: Negative for flank pain.  Musculoskeletal: Positive for back pain. Negative for neck pain and neck stiffness.  Skin: Negative for rash.  Neurological: Negative for headaches.  Hematological: Does not bruise/bleed easily.    Psychiatric/Behavioral: Negative for confusion.     Physical Exam Updated Vital Signs There were no vitals taken for this visit.  Physical Exam Vitals signs and nursing note reviewed.  Constitutional:      Appearance: Normal appearance. He is well-developed.  HENT:     Head: Atraumatic.     Nose: Nose normal.     Mouth/Throat:     Mouth: Mucous membranes are moist.     Pharynx: Oropharynx is clear. No oropharyngeal exudate or posterior oropharyngeal erythema.  Eyes:     General: No scleral icterus.    Conjunctiva/sclera: Conjunctivae normal.     Pupils: Pupils are equal, round, and reactive to light.  Neck:     Musculoskeletal: Normal range of motion and neck supple. No neck rigidity.     Trachea: No tracheal deviation.  Cardiovascular:     Rate and Rhythm: Normal rate and regular rhythm.     Pulses: Normal pulses.     Heart sounds: Normal heart sounds. No murmur. No friction rub. No gallop.   Pulmonary:     Effort: Pulmonary effort is normal. No accessory muscle usage or respiratory distress.     Breath sounds: Normal breath sounds.  Abdominal:     General: Bowel sounds are normal. There is no distension.     Palpations: Abdomen is soft.     Tenderness: There is no abdominal tenderness. There is no guarding.  Genitourinary:    Comments: No cva tenderness. Musculoskeletal:        General: No swelling or tenderness.     Comments: CTLS spine, non tender, aligned, no step off.   Skin:    General: Skin is warm and dry.     Findings: No rash.  Neurological:     Mental Status: He is alert.     Comments: Alert, speech clear. Motor/sens grossly intact bil.   Psychiatric:        Mood and Affect: Mood normal.      ED Treatments / Results  Labs (all labs ordered are listed, but only abnormal results are displayed) Results for orders placed or performed during the hospital encounter of 08/17/19  CBC  Result Value Ref Range   WBC 25.0 (H) 4.0 - 10.5 K/uL   RBC 2.63  (L) 4.22 - 5.81 MIL/uL   Hemoglobin 8.0 (L) 13.0 - 17.0 g/dL   HCT 21.322.5 (L) 08.639.0 - 57.852.0 %   MCV 85.6 80.0 - 100.0 fL   MCH 30.4 26.0 - 34.0 pg   MCHC 35.6 30.0 - 36.0 g/dL   RDW 46.925.4 (H) 62.911.5 - 52.815.5 %   Platelets 301 150 - 400 K/uL   nRBC 2.9 (H) 0.0 - 0.2 %  CMET  Result Value Ref Range   Sodium 138 135 - 145 mmol/L   Potassium 3.9 3.5 - 5.1  mmol/L   Chloride 110 98 - 111 mmol/L   CO2 22 22 - 32 mmol/L   Glucose, Bld 114 (H) 70 - 99 mg/dL   BUN 7 6 - 20 mg/dL   Creatinine, Ser 6.04 (L) 0.61 - 1.24 mg/dL   Calcium 8.4 (L) 8.9 - 10.3 mg/dL   Total Protein 7.6 6.5 - 8.1 g/dL   Albumin 4.1 3.5 - 5.0 g/dL   AST 82 (H) 15 - 41 U/L   ALT 20 0 - 44 U/L   Alkaline Phosphatase 48 38 - 126 U/L   Total Bilirubin 4.9 (H) 0.3 - 1.2 mg/dL   GFR calc non Af Amer >60 >60 mL/min   GFR calc Af Amer >60 >60 mL/min   Anion gap 6 5 - 15  Reticulocytes  Result Value Ref Range   Retic Ct Pct 7.9 (H) 0.4 - 3.1 %   RBC. 2.63 (L) 4.22 - 5.81 MIL/uL   Retic Count, Absolute 208.3 (H) 19.0 - 186.0 K/uL   Immature Retic Fract 37.8 (H) 2.3 - 15.9 %  Lactic acid  Result Value Ref Range   Lactic Acid, Venous 1.0 0.5 - 1.9 mmol/L  UA  Result Value Ref Range   Color, Urine YELLOW YELLOW   APPearance CLEAR CLEAR   Specific Gravity, Urine 1.009 1.005 - 1.030   pH 7.0 5.0 - 8.0   Glucose, UA NEGATIVE NEGATIVE mg/dL   Hgb urine dipstick MODERATE (A) NEGATIVE   Bilirubin Urine NEGATIVE NEGATIVE   Ketones, ur NEGATIVE NEGATIVE mg/dL   Protein, ur 540 (A) NEGATIVE mg/dL   Nitrite NEGATIVE NEGATIVE   Leukocytes,Ua NEGATIVE NEGATIVE   RBC / HPF 0-5 0 - 5 RBC/hpf   WBC, UA 0-5 0 - 5 WBC/hpf   Bacteria, UA NONE SEEN NONE SEEN   Squamous Epithelial / LPF 0-5 0 - 5   Mucus PRESENT     EKG None  Radiology Xr Chest Portable  Result Date: 08/17/2019 CLINICAL DATA:  Sickle cell pain EXAM: PORTABLE CHEST 1 VIEW COMPARISON:  11/18/2018 FINDINGS: Cardiomegaly. No confluent opacities or effusions. No edema.  No acute bony abnormality. IMPRESSION: Cardiomegaly.  No active disease. Electronically Signed   By: Charlett Nose M.D.   On: 08/17/2019 19:18    Procedures Procedures (including critical care time)  Medications Ordered in ED Medications  sodium chloride 0.9 % bolus 500 mL (has no administration in time range)  HYDROmorphone (DILAUDID) injection 1 mg (has no administration in time range)     Initial Impression / Assessment and Plan / ED Course  I have reviewed the triage vital signs and the nursing notes.  Pertinent labs & imaging results that were available during my care of the patient were reviewed by me and considered in my medical decision making (see chart for details).  Iv ns. o2 Badin. 500 cc ns bolus. Dilaudid iv. Labs sent.   Low grade fever in ED, lactate and cultures added to workup. Post cxs, rocephin iv.   Reviewed nursing notes and prior charts for additional history.   Labs reviewed/interpreted by me - hgb 8, c/w prior. Lactate is normal.   CXR reviewed/interpreted by me - no pna.   Pt denies source of fever. No uri symptoms. No cp or sob. No abd pain or nvd. No gu c/o. No rash/skin lesions. abd is soft nt, chest cta, spine non tender.   Recheck pt  - pain is improved but persists. Dilaudid iv.   Po fluids provided - pt tolerates  well.   Patient notes persistent pain. No change to exam. Dilaudid iv. toradol iv.   Given fever, persistent pain, medicine team consulted for admission - hospitalists consulted.   Discussed pt with hospitalists - she requests add resp panel to labs, she will admit.   Recheck pt, not toxic appearing, vitals normal, pulse ox 99%. No new c/o. Pain improved, but persists.      Final Clinical Impressions(s) / ED Diagnoses   Final diagnoses:  None    ED Discharge Orders    None         Lajean Saver, MD 08/17/19 2113

## 2019-08-17 NOTE — H&P (Addendum)
William Gallegos WUJ:[id number] DOB: 01/24/2001 DOA: 08/17/2019     PCP: Massie MaroonHollis, Lachina M, FNP   Outpatient Specialists: NONE   Patient arrived to ER on 08/17/19 at 1653  Patient coming from: home Lives  With family   Chief Complaint:   Chief Complaint  Patient presents with  . Sickle Cell Pain Crisis    HPI: William SpottedKevarian Malia is a 18 y.o. male with medical history significant of sickle cell anemia Acute chest syndrome seasonal allergies anemia, hypertension  Presented with sickle cell anemia like pain in her back typical for her presentation.  No fevers or chills but when EMS arrived and noticed the patient was febrile she had had a bit of nausea denies any known Covid exposure.  Denies any chest pain or shortness of breath vomiting nonbloody.  Fluid EMS administered pain and nausea medications with some improvement. No headache, no diarrhea Reports he does not go out of the house much have not had family or friend over   isolating at home   Infectious risk factors:  Reports   Fever,  In  ER RAPID COVID TEST in house testing  Pending  No results found for: SARSCOV2NAA   Regarding pertinent Chronic problems:  Sickle cell disease on hydroxyurea history of acute chest in the past     HTN on lisinopril   While in ER: Noted to have leukocytosis white blood cell count up to 25 hemoglobin 8 Bilirubin elevated 4.9 Reticulocyte count elevated 7.9 Patient was given Dilaudid IV With fever cultures were obtained chest x-ray showed no evidence of pneumonia no URI symptoms The following Work up has been ordered so far:  Orders Placed This Encounter  Procedures  . Blood culture (routine x 2)  . SARS CORONAVIRUS 2 (TAT 6-24 HRS) Nasopharyngeal Nasopharyngeal Swab  . XR Chest Portable  . CBC  . CMET  . Reticulocytes  . Lactic acid  . UA  . Vital signs  . Check temperature  . Consult to sickle cell medicine Reason for Consult? pain, sickle cell pain, fever  . Consult to  hospitalist  ALL PATIENTS BEING ADMITTED/HAVING PROCEDURES NEED COVID-19 SCREENING      Following Medications were ordered in ER: Medications  sodium chloride 0.9 % bolus 500 mL (0 mLs Intravenous Stopped 08/17/19 1850)  HYDROmorphone (DILAUDID) injection 1 mg (1 mg Intravenous Given 08/17/19 1724)  HYDROmorphone (DILAUDID) injection 1 mg (1 mg Intravenous Given 08/17/19 1856)  acetaminophen (TYLENOL) tablet 1,000 mg (1,000 mg Oral Given 08/17/19 2006)  HYDROmorphone (DILAUDID) injection 1 mg (1 mg Intravenous Given 08/17/19 2028)  ketorolac (TORADOL) 30 MG/ML injection 30 mg (30 mg Intravenous Given 08/17/19 2027)        Consult Orders  (From admission, onward)         Start     Ordered   08/17/19 2035  Consult to hospitalist  ALL PATIENTS BEING ADMITTED/HAVING PROCEDURES NEED COVID-19 SCREENING  Once    Comments: ALL PATIENTS BEING ADMITTED/HAVING PROCEDURES NEED COVID-19 SCREENING  Provider:  (Not yet assigned)  Question Answer Comment  Place call to: [facility]   Reason for Consult Admit      08/17/19 2034           Significant initial  Findings: Abnormal Labs Reviewed  CBC - Abnormal; Notable for the following components:      Result Value   WBC 25.0 (*)    RBC 2.63 (*)    Hemoglobin 8.0 (*)    HCT 22.5 (*)  RDW 25.4 (*)    nRBC 2.9 (*)    All other components within normal limits  COMPREHENSIVE METABOLIC PANEL - Abnormal; Notable for the following components:   Glucose, Bld 114 (*)    Creatinine, Ser 0.50 (*)    Calcium 8.4 (*)    AST 82 (*)    Total Bilirubin 4.9 (*)    All other components within normal limits  RETICULOCYTES - Abnormal; Notable for the following components:   Retic Ct Pct 7.9 (*)    RBC. 2.63 (*)    Retic Count, Absolute 208.3 (*)    Immature Retic Fract 37.8 (*)    All other components within normal limits  URINALYSIS, ROUTINE W REFLEX MICROSCOPIC - Abnormal; Notable for the following components:   Hgb urine dipstick  MODERATE (*)    Protein, ur 100 (*)    All other components within normal limits     Otherwise labs showing:    Recent Labs  Lab 08/17/19 1748  NA 138  K 3.9  CO2 22  GLUCOSE 114*  BUN 7  CREATININE 0.50*  CALCIUM 8.4*    Cr  stable,    Lab Results  Component Value Date   CREATININE 0.50 (L) 08/17/2019   CREATININE 0.60 (L) 08/05/2019   CREATININE 0.40 (L) 11/18/2018    Recent Labs  Lab 08/17/19 1748  AST 82*  ALT 20  ALKPHOS 48  BILITOT 4.9*  PROT 7.6  ALBUMIN 4.1   Lab Results  Component Value Date   CALCIUM 8.4 (L) 08/17/2019      WBC       Component Value Date/Time   WBC 25.0 (H) 08/17/2019 1748   ANC    Component Value Date/Time   NEUTROABS 3.8 08/05/2019 1004   ALC No components found for: LYMPHAB    Plt: Lab Results  Component Value Date   PLT 301 08/17/2019     Lactic Acid, Venous    Component Value Date/Time   LATICACIDVEN 1.0 08/17/2019 1850    Procalcitonin  Ordered   COVID-19 Labs  No results for input(s): DDIMER, FERRITIN, LDH, CRP in the last 72 hours.  No results found for: SARSCOV2NAA  HG/HCT  stable,      Component Value Date/Time   HGB 8.0 (L) 08/17/2019 1748   HGB 8.1 (L) 08/05/2019 1004   HCT 22.5 (L) 08/17/2019 1748   HCT 24.1 (L) 08/05/2019 1004      ECG: not  Ordered     UA   no evidence of UTI   Urine analysis:    Component Value Date/Time   COLORURINE YELLOW 08/17/2019 1920   APPEARANCEUR CLEAR 08/17/2019 1920   LABSPEC 1.009 08/17/2019 1920   PHURINE 7.0 08/17/2019 1920   GLUCOSEU NEGATIVE 08/17/2019 1920   HGBUR MODERATE (A) 08/17/2019 1920   BILIRUBINUR NEGATIVE 08/17/2019 1920   BILIRUBINUR neg 08/05/2019 1005   KETONESUR NEGATIVE 08/17/2019 1920   PROTEINUR 100 (A) 08/17/2019 1920   UROBILINOGEN 1.0 08/05/2019 1005   UROBILINOGEN 1.0 06/12/2015 2243   NITRITE NEGATIVE 08/17/2019 1920   LEUKOCYTESUR NEGATIVE 08/17/2019 1920    Ordered    CXR -    NON acute    ED Triage Vitals   Enc Vitals Group     BP 08/17/19 1726 (!) 141/81     Pulse Rate 08/17/19 1726 100     Resp 08/17/19 1726 (!) 32     Temp 08/17/19 1726 (!) 100.7 F (38.2 C)     Temp Source  08/17/19 1726 Oral     SpO2 08/17/19 1726 97 %     Weight --      Height --      Head Circumference --      Peak Flow --      Pain Score 08/17/19 1715 10     Pain Loc --      Pain Edu? --      Excl. in GC? --   TMAX(24)@       Latest  Blood pressure 127/81, pulse 100, temperature (!) 100.7 F (38.2 C), temperature source Oral, resp. rate (!) 23, SpO2 99 %.    Hospitalist was called for admission for sickle cell crisis and low-grade fever   Review of Systems:    Pertinent positives include: Fever  back pain  Constitutional:  No weight loss, night sweats,  chills, fatigue, weight loss  HEENT:  No headaches, Difficulty swallowing,Tooth/dental problems,Sore throat,  No sneezing, itching, ear ache, nasal congestion, post nasal drip,  Cardio-vascular:  No chest pain, Orthopnea, PND, anasarca, dizziness, palpitations.no Bilateral lower extremity swelling  GI:  No heartburn, indigestion, abdominal pain, nausea, vomiting, diarrhea, change in bowel habits, loss of appetite, melena, blood in stool, hematemesis Resp:  no shortness of breath at rest. No dyspnea on exertion, No excess mucus, no productive cough, No non-productive cough, No coughing up of blood.No change in color of mucus.No wheezing. Skin:  no rash or lesions. No jaundice GU:  no dysuria, change in color of urine, no urgency or frequency. No straining to urinate.  No flank pain.  Musculoskeletal:  No joint pain or no joint swelling. No decreased range of motion. No back pain.  Psych:  No change in mood or affect. No depression or anxiety. No memory loss.  Neuro: no localizing neurological complaints, no tingling, no weakness, no double vision, no gait abnormality, no slurred speech, no confusion  All systems reviewed and apart from HOPI all  are negative  Past Medical History:   Past Medical History:  Diagnosis Date  . Acute chest syndrome (HCC)   . Seasonal allergies   . Sickle cell anemia (HCC)       Past Surgical History:  Procedure Laterality Date  . adnoidectomy    . CHOLECYSTECTOMY    . TONSILLECTOMY AND ADENOIDECTOMY      Social History:  Ambulatory   independently      reports that he has never smoked. He has never used smokeless tobacco. He reports that he does not drink alcohol or use drugs.     Family History:   Family History  Problem Relation Age of Onset  . Hypertension Other   . Diabetes Other   . Cancer Other   . Stroke Other   . Sickle cell anemia Brother   . Sickle cell anemia Maternal Grandfather     Allergies: Allergies  Allergen Reactions  . 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 Peanuts [Peanut Oil] Anaphylaxis and Swelling    Swelling of face   . Erythromycin Hives  . Other Swelling    Raisins--- facial swelling Mushrooms---face swelling     Prior to Admission medications   Medication Sig Start Date End Date Taking? Authorizing Provider  folic acid (FOLVITE) 1 MG tablet Take 1 tablet (1 mg total) by mouth daily. 08/05/19  Yes Massie Maroon, FNP  hydroxyurea (HYDREA) 500 MG capsule Take 1 capsule (500 mg total) by mouth daily. TAKE 3 CAPSULES (1,500  MG TOTAL) BY MOUTH DAILY. 08/05/19  Yes Massie Maroon, FNP  lisinopril (ZESTRIL) 2.5 MG tablet Take 1 tablet (2.5 mg total) by mouth daily. 08/05/19  Yes Massie Maroon, FNP  oxyCODONE-acetaminophen (PERCOCET/ROXICET) 5-325 MG tablet Take 1 tablet by mouth every 8 (eight) hours as needed for moderate pain. 11/20/18  Yes Massie Maroon, FNP  ergocalciferol (VITAMIN D2) 1.25 MG (50000 UT) capsule Take 1 capsule (50,000 Units total) by mouth once a week. 08/06/19   Massie Maroon, FNP   Physical Exam: Blood pressure 127/81, pulse 100, temperature (!) 100.7 F (38.2 C), temperature  source Oral, resp. rate (!) 23, SpO2 99 %. 1. General:  in No  Acute distress   Chronically ill -appearing 2. Psychological: Alert and  Oriented 3. Head/ENT:     Dry Mucous Membranes                          Head Non traumatic, neck supple                          Normal   Dentition 4. SKIN:   decreased Skin turgor,  Skin clean Dry and intact no rash 5. Heart: Regular rate and rhythm no  Murmur, no Rub or gallop 6. Lungs:  no wheezes or crackles   7. Abdomen: Soft,  non-tender, Non distended  bowel sounds present 8. Lower extremities: no clubbing, cyanosis, no  edema 9. Neurologically Grossly intact, moving all 4 extremities equally   10. MSK: Normal range of motion   All other LABS:     Recent Labs  Lab 08/17/19 1748  WBC 25.0*  HGB 8.0*  HCT 22.5*  MCV 85.6  PLT 301     Recent Labs  Lab 08/17/19 1748  NA 138  K 3.9  CL 110  CO2 22  GLUCOSE 114*  BUN 7  CREATININE 0.50*  CALCIUM 8.4*     Recent Labs  Lab 08/17/19 1748  AST 82*  ALT 20  ALKPHOS 48  BILITOT 4.9*  PROT 7.6  ALBUMIN 4.1    Cultures:    Component Value Date/Time   SDES  11/16/2018 2336    URINE, RANDOM Performed at [facility], [address], [place], [place] [address]    SPECREQUEST  11/16/2018 2336    NONE Performed at [facility], [address] [place], [place] [address]    CULT  11/16/2018 2336    NO GROWTH Performed at [facility], [address], [place], [place] [address]    REPTSTATUS 11/18/2018 FINAL 11/16/2018 2336     Radiological Exams on Admission: Xr Chest Portable  Result Date: 08/17/2019 CLINICAL DATA:  Sickle cell pain EXAM: PORTABLE CHEST 1 VIEW COMPARISON:  11/18/2018 FINDINGS: Cardiomegaly. No confluent opacities or effusions. No edema. No acute bony abnormality. IMPRESSION: Cardiomegaly.  No active disease. Electronically Signed   By: Charlett Nose M.D.   On: 08/17/2019 19:18    Chart has been reviewed     Assessment/Plan  18 y.o. male with medical history significant of sickle cell anemia Acute chest syndrome seasonal allergies anemia, hypertension  Admitted for sickle cell crisis  Present on Admission:    . Sickle cell anemia with crisis (HCC)- - will admit per sickle cell protocol,    control pain,    hydrate with IVF D5 .45% Saline @ 100 mls/hour,    Weight based  Dilaudid PCA reduced dosage patient does not take narcotics on a regular basis   continue hydroxyurea and folic acid   Transfuse as needed if Hg drops significantly below baseline.    No evidence of acute chest at this time  CXR unremarkable   Sickle cell team to take over management in AM  . Leucocytosis in the setting of sickle cell crisis  . Fever past have had fever with his sickle cell crisis.  But will monitor for any signs of infection obtain respiratory panel Covid test is currently pending Other plan as per orders.  DVT prophylaxis:   Lovenox     Code Status:  FULL CODE   as per patient   I had personally discussed CODE STATUS with patient    Family Communication:   Family   at  Bedside  plan of care was discussed  with   Brother   Disposition Plan:   To home once workup is complete and patient is stable                      Consults called: none  Admission status: as PUI until Covid test in negative ED Disposition    ED Disposition Condition [facility] Area: [place] [[id number]]  Level of Care: Med-Surg [16]  Covid Evaluation: Person Under Investigation (PUI)  Diagnosis: Sickle cell pain crisis [facility]) [[id number]]  Admitting Physician: Toy Baker [[id number]]  Attending Physician: Toy Baker [[id number]]  Estimated length of stay: 3 - 4 days  Certification:: I certify this patient will need inpatient services for at least 2 midnights  PT Class (Do Not Modify): Inpatient [101]  PT Acc Code (Do Not Modify): Private [1]        inpatient     Expect 2 midnight  stay secondary to severity of patient's current illness including      Severe lab/radiological/exam abnormalities including: Leukocytosis   and extensive comorbidities including:     sickle cell disease  .    That are currently affecting medical management.   I expect  patient to be hospitalized for 2 midnights requiring inpatient medical care.  Patient is at high risk for adverse outcome (such as loss of life or disability) if not treated.  Indication for inpatient stay as follows:      severe pain requiring acute inpatient management,  inability to maintain oral hydration    Need for  IV fluids, V pain medications      Level of care        medical floor     Precautions:   Airborne and Contact precautions  PPE: Used by the provider:   P100  eye Goggles,  Gloves      Yatzary Merriweather 08/17/2019, 10:42 PM    [facility]     after 2 AM please page floor coverage PA If 7AM-7PM, please contact the day team taking care of the patient using [email or web address]

## 2019-08-17 NOTE — ED Triage Notes (Signed)
He reports sickle cell pain in his back and radiating into bilat. Legs. He rec'd. 150 mcg of IV Fentanyl and 4 mg of IV Zofran en route to hospital. He arrives in no distress.

## 2019-08-18 DIAGNOSIS — D5700 Hb-SS disease with crisis, unspecified: Principal | ICD-10-CM

## 2019-08-18 DIAGNOSIS — R509 Fever, unspecified: Secondary | ICD-10-CM

## 2019-08-18 DIAGNOSIS — D72829 Elevated white blood cell count, unspecified: Secondary | ICD-10-CM

## 2019-08-18 LAB — RESPIRATORY PANEL BY PCR

## 2019-08-18 LAB — DIFFERENTIAL
Abs Immature Granulocytes: 0.42 10*3/uL — ABNORMAL HIGH (ref 0.00–0.07)
Basophils Absolute: 0.1 10*3/uL (ref 0.0–0.1)
Basophils Relative: 0 %
Eosinophils Absolute: 0 10*3/uL (ref 0.0–0.5)
Eosinophils Relative: 0 %
Immature Granulocytes: 2 %
Lymphocytes Relative: 21 %
Lymphs Abs: 4.9 10*3/uL — ABNORMAL HIGH (ref 0.7–4.0)
Monocytes Absolute: 1.4 10*3/uL — ABNORMAL HIGH (ref 0.1–1.0)
Monocytes Relative: 6 %
Neutro Abs: 15.8 10*3/uL — ABNORMAL HIGH (ref 1.7–7.7)
Neutrophils Relative %: 71 %

## 2019-08-18 LAB — MAGNESIUM: Magnesium: 1.7 mg/dL (ref 1.7–2.4)

## 2019-08-18 LAB — PHOSPHORUS: Phosphorus: 3.8 mg/dL (ref 2.5–4.6)

## 2019-08-18 LAB — SARS CORONAVIRUS 2 (TAT 6-24 HRS): SARS Coronavirus 2: NEGATIVE

## 2019-08-18 MED ORDER — HYDROXYUREA 500 MG PO CAPS
1500.0000 mg | ORAL_CAPSULE | Freq: Every day | ORAL | Status: DC
  Administered 2019-08-18 – 2019-08-19 (×2): 1500 mg via ORAL
  Filled 2019-08-18 (×2): qty 3

## 2019-08-18 MED ORDER — FOLIC ACID 1 MG PO TABS
1.0000 mg | ORAL_TABLET | Freq: Every day | ORAL | Status: DC
  Administered 2019-08-18 – 2019-08-19 (×2): 1 mg via ORAL
  Filled 2019-08-18 (×2): qty 1

## 2019-08-18 MED ORDER — NALOXONE HCL 0.4 MG/ML IJ SOLN: 0.4000 mg | INTRAMUSCULAR | Status: DC | PRN

## 2019-08-18 MED ORDER — ONDANSETRON HCL 4 MG/2ML IJ SOLN: 4.0000 mg | INTRAMUSCULAR | Status: DC | PRN

## 2019-08-18 MED ORDER — BISACODYL 5 MG PO TBEC: 5.0000 mg | DELAYED_RELEASE_TABLET | Freq: Every day | ORAL | Status: DC | PRN

## 2019-08-18 MED ORDER — DEXTROSE-NACL 5-0.45 % IV SOLN
INTRAVENOUS | Status: DC
  Administered 2019-08-18 (×2): via INTRAVENOUS

## 2019-08-18 MED ORDER — HYDROXYZINE HCL 25 MG PO TABS: 25.0000 mg | ORAL_TABLET | ORAL | Status: DC | PRN

## 2019-08-18 MED ORDER — ONDANSETRON HCL 4 MG/2ML IJ SOLN: 4.0000 mg | Freq: Four times a day (QID) | INTRAMUSCULAR | Status: DC | PRN

## 2019-08-18 MED ORDER — KETOROLAC TROMETHAMINE 30 MG/ML IJ SOLN
30.0000 mg | Freq: Four times a day (QID) | INTRAMUSCULAR | Status: DC
  Administered 2019-08-18 – 2019-08-19 (×6): 30 mg via INTRAVENOUS
  Filled 2019-08-18 (×6): qty 1

## 2019-08-18 MED ORDER — SODIUM CHLORIDE 0.9% FLUSH: 9.0000 mL | INTRAVENOUS | Status: DC | PRN

## 2019-08-18 MED ORDER — DIPHENHYDRAMINE HCL 12.5 MG/5ML PO ELIX: 12.5000 mg | ORAL_SOLUTION | Freq: Four times a day (QID) | ORAL | Status: DC | PRN

## 2019-08-18 MED ORDER — PNEUMOCOCCAL VAC POLYVALENT 25 MCG/0.5ML IJ INJ
0.5000 mL | INJECTION | INTRAMUSCULAR | Status: AC
  Administered 2019-08-19: 0.5 mL via INTRAMUSCULAR
  Filled 2019-08-18: qty 0.5

## 2019-08-18 MED ORDER — ENOXAPARIN SODIUM 40 MG/0.4ML ~~LOC~~ SOLN
40.0000 mg | Freq: Every day | SUBCUTANEOUS | Status: DC
  Administered 2019-08-18 – 2019-08-19 (×2): 40 mg via SUBCUTANEOUS
  Filled 2019-08-18 (×2): qty 0.4

## 2019-08-18 MED ORDER — ONDANSETRON HCL 4 MG PO TABS: 4.0000 mg | ORAL_TABLET | ORAL | Status: DC | PRN

## 2019-08-18 MED ORDER — LISINOPRIL 5 MG PO TABS
2.5000 mg | ORAL_TABLET | Freq: Every day | ORAL | Status: DC
  Administered 2019-08-18 – 2019-08-19 (×2): 2.5 mg via ORAL
  Filled 2019-08-18 (×2): qty 1

## 2019-08-18 MED ORDER — DIPHENHYDRAMINE HCL 50 MG/ML IJ SOLN: 12.5000 mg | Freq: Four times a day (QID) | INTRAMUSCULAR | Status: DC | PRN

## 2019-08-18 MED ORDER — SENNOSIDES-DOCUSATE SODIUM 8.6-50 MG PO TABS
1.0000 | ORAL_TABLET | Freq: Two times a day (BID) | ORAL | Status: DC
  Administered 2019-08-18 – 2019-08-19 (×3): 1 via ORAL
  Filled 2019-08-18 (×4): qty 1

## 2019-08-18 MED ORDER — POLYETHYLENE GLYCOL 3350 17 G PO PACK: 17.0000 g | PACK | Freq: Every day | ORAL | Status: DC | PRN

## 2019-08-18 MED ORDER — HYDROMORPHONE 1 MG/ML IV SOLN
INTRAVENOUS | Status: DC
  Administered 2019-08-18: 0.4 mg via INTRAVENOUS
  Administered 2019-08-18: 0.6 mg via INTRAVENOUS
  Administered 2019-08-18: 0.2 mg via INTRAVENOUS
  Administered 2019-08-18: 1.1 mg via INTRAVENOUS
  Administered 2019-08-18: 02:00:00 via INTRAVENOUS
  Administered 2019-08-19: 0 mg via INTRAVENOUS
  Administered 2019-08-19: 0.2 mg via INTRAVENOUS
  Filled 2019-08-18 (×3): qty 30

## 2019-08-18 NOTE — Plan of Care (Signed)
  Problem: Clinical Measurements: Goal: Respiratory complications will improve Outcome: Progressing Goal: Cardiovascular complication will be avoided Outcome: Progressing   Problem: Activity: Goal: Risk for activity intolerance will decrease Outcome: Progressing   Problem: Coping: Goal: Level of anxiety will decrease Outcome: Progressing   Problem: Pain Managment: Goal: General experience of comfort will improve Outcome: Progressing   Problem: Education: Goal: Knowledge of General Education information will improve Description: Including pain rating scale, medication(s)/side effects and non-pharmacologic comfort measures Outcome: Completed/Met

## 2019-08-18 NOTE — Progress Notes (Signed)
Subjective: William Gallegos, a 18 year old male with a medical history significant for sickle cell disease was admitted in sickle cell crisis. Patient continues to complain of pain primarily to low back and lower extremities.  Pain intensity is 5/10 characterized as constant and throbbing.  Patient states that he rarely has sickle cell crisis and typically does not have pain.  He cannot manage at home at current pain intensity. Patient is afebrile and maintaining oxygen saturation at 100% on RA.  Patient denies headache, chest pain, shortness of breath, paresthesias, dizziness, dysuria, nausea, vomiting, or diarrhea.  Objective:  Vital signs in last 24 hours:  Vitals:   08/18/19 0836 08/18/19 0911 08/18/19 1238 08/18/19 1431  BP:  122/73  125/65  Pulse:  66  68  Resp: 16 16 17 15   Temp:  98.1 F (36.7 C)  98.5 F (36.9 C)  TempSrc:  Oral  Oral  SpO2: 100% 100% 100% 99%  Weight:      Height:        Intake/Output from previous day:   Intake/Output Summary (Last 24 hours) at 08/18/2019 1617 Last data filed at 08/18/2019 0800 Gross per 24 hour  Intake 466.68 ml  Output 1000 ml  Net -533.32 ml    Physical Exam: General: Alert, awake, oriented x3, in no acute distress.  HEENT: [place]/AT PEERL, EOMI Neck: Trachea midline,  no masses, no thyromegal,y no JVD, no carotid bruit OROPHARYNX:  Moist, No exudate/ erythema/lesions.  Heart: Regular rate and rhythm, without murmurs, rubs, gallops, PMI non-displaced, no heaves or thrills on palpation.  Lungs: Clear to auscultation, no wheezing or rhonchi noted. No increased vocal fremitus resonant to percussion  Abdomen: Soft, nontender, nondistended, positive bowel sounds, no masses no hepatosplenomegaly noted..  Neuro: No focal neurological deficits noted cranial nerves II through XII grossly intact. DTRs 2+ bilaterally upper and lower extremities. Strength 5 out of 5 in bilateral upper and lower extremities. Musculoskeletal: No warm swelling  or erythema around joints, no spinal tenderness noted. Psychiatric: Patient alert and oriented x3, good insight and cognition, good recent to remote recall. Lymph node survey: No cervical axillary or inguinal lymphadenopathy noted.  Lab Results:  Basic Metabolic Panel:    Component Value Date/Time   NA 138 08/17/2019 1748   NA 139 08/05/2019 1004   K 3.9 08/17/2019 1748   CL 110 08/17/2019 1748   CO2 22 08/17/2019 1748   BUN 7 08/17/2019 1748   BUN 4 (L) 08/05/2019 1004   CREATININE 0.50 (L) 08/17/2019 1748   CREATININE 0.52 (L) 01/18/2017 1059   GLUCOSE 114 (H) 08/17/2019 1748   CALCIUM 8.4 (L) 08/17/2019 1748   CBC:    Component Value Date/Time   WBC 25.0 (H) 08/17/2019 1748   HGB 8.0 (L) 08/17/2019 1748   HGB 8.1 (L) 08/05/2019 1004   HCT 22.5 (L) 08/17/2019 1748   HCT 24.1 (L) 08/05/2019 1004   PLT 301 08/17/2019 1748   PLT 435 08/05/2019 1004   MCV 85.6 08/17/2019 1748   MCV 86 08/05/2019 1004   NEUTROABS 15.8 (H) 08/18/2019 0255   NEUTROABS 3.8 08/05/2019 1004   LYMPHSABS 4.9 (H) 08/18/2019 0255   LYMPHSABS 5.1 (H) 08/05/2019 1004   MONOABS 1.4 (H) 08/18/2019 0255   EOSABS 0.0 08/18/2019 0255   EOSABS 0.4 08/05/2019 1004   BASOSABS 0.1 08/18/2019 0255   BASOSABS 0.1 08/05/2019 1004    Recent Results (from the past 240 hour(s))  Blood culture (routine x 2)     Status: None (Preliminary  result)   Collection Time: 08/17/19  6:31 PM   Specimen: BLOOD  Result Value Ref Range Status   Specimen Description   Final    BLOOD RIGHT ANTECUBITAL Performed at [facility], [address] [place], [place] [address]    Special Requests   Final    BOTTLES DRAWN AEROBIC AND ANAEROBIC Blood Culture adequate volume Performed at [facility], [address] [place], [place] [address]    Culture   Final    NO GROWTH < 12 HOURS Performed at [facility], [address] [place], [place] [address]    Report Status PENDING   Incomplete  Blood culture (routine x 2)     Status: None (Preliminary result)   Collection Time: 08/17/19  7:20 PM   Specimen: BLOOD LEFT FOREARM  Result Value Ref Range Status   Specimen Description   Final    BLOOD LEFT FOREARM Performed at [facility], [address] [place], [place] [address]    Special Requests   Final    BOTTLES DRAWN AEROBIC AND ANAEROBIC Blood Culture results may not be optimal due to an excessive volume of blood received in culture bottles Performed at [facility], [address] [place], [place] [address]    Culture   Final    NO GROWTH < 12 HOURS Performed at [facility], [address] [place], [place] [address]    Report Status PENDING  Incomplete  SARS CORONAVIRUS 2 (TAT 6-24 HRS) Nasopharyngeal Nasopharyngeal Swab     Status: None   Collection Time: 08/17/19  8:40 PM   Specimen: Nasopharyngeal Swab  Result Value Ref Range Status   SARS Coronavirus 2 NEGATIVE NEGATIVE Final    Comment: (NOTE) SARS-CoV-2 target nucleic acids are NOT DETECTED. The SARS-CoV-2 RNA is generally detectable in upper and lower respiratory specimens during the acute phase of infection. Negative results do not preclude SARS-CoV-2 infection, do not rule out co-infections with other pathogens, and should not be used as the sole basis for treatment or other patient management decisions. Negative results must be combined with clinical observations, patient history, and epidemiological information. The expected result is Negative. Fact Sheet for Patients: HairSlick.nohttps://www.fda.gov/media/138098/download Fact Sheet for Healthcare Providers: quierodirigir.comhttps://www.fda.gov/media/138095/download This test is not yet approved or cleared by the [place] FDA and  has been authorized for detection and/or diagnosis of SARS-CoV-2 by FDA under an Emergency Use Authorization (EUA). This EUA will remain  in effect (meaning this test can be used) for the duration of  the COVID-19 declaration under Section 56 4(b)(1) of the Act, 21 U.S.C. section 360bbb-3(b)(1), unless the authorization is terminated or revoked sooner. Performed at [facility], [address] [place], [place] [address]   Respiratory Panel by PCR     Status: None   Collection Time: 08/18/19  7:49 AM   Specimen: Nasopharyngeal Swab; Respiratory  Result Value Ref Range Status   Adenovirus NOT DETECTED NOT DETECTED Final   Coronavirus 229E NOT DETECTED NOT DETECTED Final    Comment: (NOTE) The Coronavirus on the Respiratory Panel, DOES NOT test for the novel  Coronavirus (2019 nCoV)    Coronavirus HKU1 NOT DETECTED NOT DETECTED Final   Coronavirus NL63 NOT DETECTED NOT DETECTED Final   Coronavirus OC43 NOT DETECTED NOT DETECTED Final   Metapneumovirus NOT DETECTED NOT DETECTED Final   Rhinovirus / Enterovirus NOT DETECTED NOT DETECTED Final   Influenza A NOT DETECTED NOT DETECTED  Final   Influenza B NOT DETECTED NOT DETECTED Final   Parainfluenza Virus 1 NOT DETECTED NOT DETECTED Final   Parainfluenza Virus 2 NOT DETECTED NOT DETECTED Final   Parainfluenza Virus 3 NOT DETECTED NOT DETECTED Final   Parainfluenza Virus 4 NOT DETECTED NOT DETECTED Final   Respiratory Syncytial Virus NOT DETECTED NOT DETECTED Final   Bordetella pertussis NOT DETECTED NOT DETECTED Final   Chlamydophila pneumoniae NOT DETECTED NOT DETECTED Final   Mycoplasma pneumoniae NOT DETECTED NOT DETECTED Final    Comment: Performed at [facility], [address], [place], [place] [address]    Studies/Results: Xr Chest Portable  Result Date: 08/17/2019 CLINICAL DATA:  Sickle cell pain EXAM: PORTABLE CHEST 1 VIEW COMPARISON:  11/18/2018 FINDINGS: Cardiomegaly. No confluent opacities or effusions. No edema. No acute bony abnormality. IMPRESSION: Cardiomegaly.  No active disease. Electronically Signed   By: Charlett Nose M.D.   On: 08/17/2019 19:18    Medications: Scheduled Meds: . enoxaparin  (LOVENOX) injection  40 mg Subcutaneous Daily  . folic acid  1 mg Oral Daily  . HYDROmorphone   Intravenous Q4H  . hydroxyurea  1,500 mg Oral Daily  . ketorolac  30 mg Intravenous Q6H  . lisinopril  2.5 mg Oral Daily  . [START ON 08/19/2019] pneumococcal 23 valent vaccine  0.5 mL Intramuscular Tomorrow-1000  . senna-docusate  1 tablet Oral BID   Continuous Infusions: . dextrose 5 % and 0.45% NaCl 100 mL/hr at 08/18/19 1018   PRN Meds:.bisacodyl, diphenhydrAMINE **OR** diphenhydrAMINE, hydrOXYzine, naloxone **AND** sodium chloride flush, ondansetron (ZOFRAN) IV, ondansetron **OR** ondansetron (ZOFRAN) IV, polyethylene glycol  Consultants:  None  Procedures:  None  Antibiotics:  None  Assessment/Plan: Active Problems:   Fever   Leucocytosis   Sickle cell anemia with crisis (HCC)   Sickle cell pain crisis (HCC)  Sickle cell disease with pain crisis: Continue D5 0.45% saline at 50 mL/h Continue full dose Dilaudid PCA Toradol 15 mg IV every 6 hours for total of 5 days Monitor vital signs very closely Supplemental oxygen as needed Reevaluate pain scale regularly  Leukocytosis: WBCs 25.  Patient afebrile.  COVID-19 negative.  Respiratory panel negative.  Chest x-ray showed no acute cardiopulmonary process.  Lactic acid and procalcitonin negative.  Repeat CBC in a.m.  Sickle cell anemia: Hemoglobin 8.0, slightly below patient's baseline but does not meet threshold for transfusion.  Continue to monitor closely.  Proteinuria: Continue home medication  Code Status: Full Code Family Communication: N/A Disposition Plan: Not yet ready for discharge  Nolon Nations  APRN, MSN, FNP-C [facility] [address] [place], [place] [address] [phone]  If 5PM-7AM, please contact night-coverage.  08/18/2019, 4:17 PM  LOS: 1 day

## 2019-08-18 NOTE — Progress Notes (Signed)
Report given to [facility]. Stacey Drain

## 2019-08-19 LAB — CBC
HCT: 20.3 % — ABNORMAL LOW (ref 39.0–52.0)
Hemoglobin: 7.2 g/dL — ABNORMAL LOW (ref 13.0–17.0)
MCH: 30 pg (ref 26.0–34.0)
MCHC: 35.5 g/dL (ref 30.0–36.0)
MCV: 84.6 fL (ref 80.0–100.0)
Platelets: 237 10*3/uL (ref 150–400)
RBC: 2.4 MIL/uL — ABNORMAL LOW (ref 4.22–5.81)
RDW: 23.3 % — ABNORMAL HIGH (ref 11.5–15.5)
WBC: 15.1 10*3/uL — ABNORMAL HIGH (ref 4.0–10.5)
nRBC: 3.3 % — ABNORMAL HIGH (ref 0.0–0.2)

## 2019-08-19 LAB — BASIC METABOLIC PANEL
Anion gap: 9 (ref 5–15)
BUN: 7 mg/dL (ref 6–20)
CO2: 22 mmol/L (ref 22–32)
Calcium: 8.1 mg/dL — ABNORMAL LOW (ref 8.9–10.3)
Chloride: 107 mmol/L (ref 98–111)
Creatinine, Ser: 0.48 mg/dL — ABNORMAL LOW (ref 0.61–1.24)
GFR calc Af Amer: >60 mL/min (ref >60)
GFR calc non Af Amer: >60 mL/min (ref >60)
Glucose, Bld: 107 mg/dL — ABNORMAL HIGH (ref 70–99)
Potassium: 3.7 mmol/L (ref 3.5–5.1)
Sodium: 138 mmol/L (ref 135–145)

## 2019-08-19 LAB — HIV ANTIBODY (ROUTINE TESTING W REFLEX): HIV Screen 4th Generation wRfx: NONREACTIVE — AB

## 2019-08-19 MED ORDER — IBUPROFEN 800 MG PO TABS: 800.0000 mg | ORAL_TABLET | Freq: Three times a day (TID) | ORAL | 0 refills | Status: DC | PRN

## 2019-08-19 MED ORDER — OXYCODONE-ACETAMINOPHEN 5-325 MG PO TABS: 1.0000 | ORAL_TABLET | Freq: Four times a day (QID) | ORAL | 0 refills | Status: DC | PRN

## 2019-08-19 MED ORDER — OXYCODONE-ACETAMINOPHEN 5-325 MG PO TABS
1.0000 | ORAL_TABLET | Freq: Once | ORAL | Status: AC
  Administered 2019-08-19: 1 via ORAL
  Filled 2019-08-19: qty 1

## 2019-08-19 NOTE — Discharge Summary (Signed)
Physician Discharge Summary  [name]:[id number] DOB: [birth year] DOA: 08/17/2019  PCP: Massie Maroon, FNP  Admit date: 08/17/2019  Discharge date: 08/19/2019  Discharge Diagnoses:  Active Problems:   Fever   Leucocytosis   Sickle cell anemia with crisis (HCC)   Sickle cell pain crisis (HCC)   Febrile illness   Discharge Condition: Stable  Disposition:  Follow-up Information    Massie Maroon, FNP Follow up.   Specialty: Family Medicine Contact information: [address] [place] [address] [phone]          Pt is discharged home in good condition and is to follow up with Massie Maroon, FNP in 1 week to have labs evaluated. Salaam Battershell is instructed to increase activity slowly and balance with rest for the next few days, and use prescribed medication to complete treatment of pain  Diet: Regular Wt Readings from Last 3 Encounters:  08/18/19 74.6 kg  08/05/19 73 kg  04/29/19 71.7 kg    History of present illness:  William Gallegos, a 18 year old male with a medical history significant for sickle cell disease history of acute chest syndrome, environmental allergies, and proteinuria presented to ER complaining of back pain that is consistent with sickle cell.  No fevers or chills, however when EMS They noticed the patient was febrile.  He also had a bit of nausea.  He denied Covid exposure.  He says he does not feel out of the house very much or have family or friends over.  He has been mostly isolating at home also denied chest pain, shortness of breath, vomiting, headache, or diarrhea..  Emergency department administered IV pain and nausea medications with some improvement  ER course: Patient noted to have leukocytosis, white blood, hemoglobin 8.0, bilirubin elevated at 4.9.  Robust reticulocytosis.  COVID-19 test negative.  Chest x-ray showed no evidence of pneumonia or upper respiratory symptoms.  Lactic acid negative.  Pain  persists despite IV Dilaudid  Hospital Course: Sickle cell disease with pain crisis Patient was admitted for sickle cell pain crisis and managed appropriately with IVF, IV Dilaudid via PCA and IV Toradol, as well as other adjunct therapies per sickle cell pain management protocols.  Patient says that pain intensity is 2/10 and he can manage at home.  He is requesting to be discharged.  Patient has infrequent pain crises and does not typically take opiate medications.  Percocet 5-3 25 every 6 hours as #30 ibuprofen 800 mg every 8 hours #30 were both sent to patient's pharmacy. Patient's WBCs on admission was 25,000, decreased significantly on today.  Patient to follow-up in 1 week for CBC and CMP  Patient is alert, oriented, and ambulating without assistance.  He is afebrile and maintaining oxygen saturation above 90%.  Patient was discharged home today in a hemodynamically stable condition.   Discharge Exam: Vitals:   08/19/19 0409 08/19/19 0622  BP:  110/69  Pulse:  98  Resp: 19 (!) 21  Temp:  99.1 F (37.3 C)  SpO2: 96% 95%   Vitals:   08/19/19 0003 08/19/19 0213 08/19/19 0409 08/19/19 0622  BP:  119/66  110/69  Pulse:  91  98  Resp: (!) 21  Temp:  97.8 F (36.6 C)  99.1 F (37.3 C)  TempSrc:  Oral  Oral  SpO2: 98% 100% 96% 95%  Weight:      Height:        General appearance : Awake, alert, not in  any distress. Speech Clear. Not toxic looking HEENT: Atraumatic and Normocephalic, pupils equally reactive to light and accomodation Neck: Supple, no JVD. No cervical lymphadenopathy.  Chest: Good air entry bilaterally, no added sounds  CVS: S1 S2 regular, no murmurs.  Abdomen: Bowel sounds present, Non tender and not distended with no gaurding, rigidity or rebound. 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   folic acid 1 MG tablet Commonly known as: FOLVITE Take 1 tablet (1 mg total) by mouth daily.   hydroxyurea 500 MG capsule Commonly known as: HYDREA Take 1 capsule (500 mg total) by mouth daily. TAKE 3 CAPSULES (1,500 MG TOTAL) BY MOUTH DAILY. What changed:   how much to take  additional instructions   ibuprofen 800 MG tablet Commonly known as: ADVIL Take 1 tablet (800 mg total) by mouth every 8 (eight) hours as needed.   lisinopril 2.5 MG tablet Commonly known as: ZESTRIL Take 1 tablet (2.5 mg total) by mouth daily.   oxyCODONE-acetaminophen 5-325 MG tablet Commonly known as: PERCOCET/ROXICET Take 1 tablet by mouth every 6 (six) hours as needed for moderate pain. What changed: when to take this       The results of significant diagnostics from this hospitalization (including imaging, microbiology, ancillary and laboratory) are listed below for reference.    Significant Diagnostic Studies: Xr Chest Portable  Result Date: 08/17/2019 CLINICAL DATA:  Sickle cell pain EXAM: PORTABLE CHEST 1 VIEW COMPARISON:  11/18/2018 FINDINGS: Cardiomegaly. No confluent opacities or effusions. No edema. No acute bony abnormality. IMPRESSION: Cardiomegaly.  No active disease. Electronically Signed   By: Rolm Baptise M.D.   On: 08/17/2019 19:18    Microbiology: Recent Results (from the past 240  hour(s))  Blood culture (routine x 2)     Status: None (Preliminary result)   Collection Time: 08/17/19  6:31 PM   Specimen: BLOOD  Result Value Ref Range Status   Specimen Description   Final    BLOOD RIGHT ANTECUBITAL Performed at [facility] [address], [place], [place] [address]    Special Requests   Final    BOTTLES DRAWN AEROBIC AND ANAEROBIC Blood Culture adequate volume Performed at [facility] [address] [place], [place] [address]    Culture   Final    NO GROWTH < 12 HOURS Performed at [facility] [address], [place], [place] [address]    Report Status PENDING  Incomplete  Blood culture (routine x 2)     Status: None (Preliminary result)   Collection Time: 08/17/19  7:20 PM   Specimen: BLOOD LEFT FOREARM  Result Value Ref Range Status  Specimen Description   Final    BLOOD LEFT FOREARM Performed at [facility], [address] [place], [place] [address]    Special Requests   Final    BOTTLES DRAWN AEROBIC AND ANAEROBIC Blood Culture results may not be optimal due to an excessive volume of blood received in culture bottles Performed at [facility], [address] [place], [place] [address]    Culture   Final    NO GROWTH < 12 HOURS Performed at [facility], [address] [place], [place] [address]    Report Status PENDING  Incomplete  SARS CORONAVIRUS 2 (TAT 6-24 HRS) Nasopharyngeal Nasopharyngeal Swab     Status: None   Collection Time: 08/17/19  8:40 PM   Specimen: Nasopharyngeal Swab  Result Value Ref Range Status   SARS Coronavirus 2 NEGATIVE NEGATIVE Final    Comment: (NOTE) SARS-CoV-2 target nucleic acids are NOT DETECTED. The SARS-CoV-2 RNA is generally detectable in upper and lower respiratory specimens during the acute phase of infection. Negative results do not preclude SARS-CoV-2 infection, do not rule out co-infections with other pathogens, and should  not be used as the sole basis for treatment or other patient management decisions. Negative results must be combined with clinical observations, patient history, and epidemiological information. The expected result is Negative. Fact Sheet for Patients: HairSlick.nohttps://www.fda.gov/media/138098/download Fact Sheet for Healthcare Providers: quierodirigir.comhttps://www.fda.gov/media/138095/download This test is not yet approved or cleared by the [place] FDA and  has been authorized for detection and/or diagnosis of SARS-CoV-2 by FDA under an Emergency Use Authorization (EUA). This EUA will remain  in effect (meaning this test can be used) for the duration of the COVID-19 declaration under Section 56 4(b)(1) of the Act, 21 U.S.C. section 360bbb-3(b)(1), unless the authorization is terminated or revoked sooner. Performed at [facility], [address] [place], [place] [address]   Respiratory Panel by PCR     Status: None   Collection Time: 08/18/19  7:49 AM   Specimen: Nasopharyngeal Swab; Respiratory  Result Value Ref Range Status   Adenovirus NOT DETECTED NOT DETECTED Final   Coronavirus 229E NOT DETECTED NOT DETECTED Final    Comment: (NOTE) The Coronavirus on the Respiratory Panel, DOES NOT test for the novel  Coronavirus (2019 nCoV)    Coronavirus HKU1 NOT DETECTED NOT DETECTED Final   Coronavirus NL63 NOT DETECTED NOT DETECTED Final   Coronavirus OC43 NOT DETECTED NOT DETECTED Final   Metapneumovirus NOT DETECTED NOT DETECTED Final   Rhinovirus / Enterovirus NOT DETECTED NOT DETECTED Final   Influenza A NOT DETECTED NOT DETECTED Final   Influenza B NOT DETECTED NOT DETECTED Final   Parainfluenza Virus 1 NOT DETECTED NOT DETECTED Final   Parainfluenza Virus 2 NOT DETECTED NOT DETECTED Final   Parainfluenza Virus 3 NOT DETECTED NOT DETECTED Final   Parainfluenza Virus 4 NOT DETECTED NOT DETECTED Final   Respiratory Syncytial Virus NOT DETECTED NOT DETECTED Final   Bordetella pertussis  NOT DETECTED NOT DETECTED Final   Chlamydophila pneumoniae NOT DETECTED NOT DETECTED Final   Mycoplasma pneumoniae NOT DETECTED NOT DETECTED Final    Comment: Performed at [facility], [address] [place], [place] [address]     Labs: Basic Metabolic Panel: Recent Labs  Lab 08/17/19 1748 08/18/19 0255 08/19/19 0629  NA 138  --  138  K 3.9  --  3.7  CL 110  --  107  CO2 22  --  22  GLUCOSE 114*  --  107*  BUN 7  --  7  CREATININE 0.50*  --  0.48*  CALCIUM 8.4*  --  8.1*  MG  --  1.7  --   PHOS  --  3.8  --    Liver Function Tests: Recent Labs  Lab 08/17/19 1748  AST 82*  ALT 20  ALKPHOS 48  BILITOT 4.9*  PROT 7.6  ALBUMIN 4.1   No results for input(s): LIPASE, AMYLASE in the last 168 hours. No results for input(s): AMMONIA in the last 168 hours. CBC: Recent Labs  Lab 08/17/19 1748 08/18/19 0255 08/19/19 0629  WBC 25.0*  --  15.1*  NEUTROABS  --  15.8*  --   HGB 8.0*  --  7.2*  HCT 22.5*  --  20.3*  MCV 85.6  --  84.6  PLT 301  --  237   Cardiac Enzymes: No results for input(s): CKTOTAL, CKMB, CKMBINDEX, TROPONINI in the last 168 hours. BNP: Invalid input(s): POCBNP CBG: No results for input(s): GLUCAP in the last 168 hours.  Time coordinating discharge: 50 minutes  Signed:  Nolon Nations  APRN, MSN, FNP-C Patient Care [facility] [address] [place], [place] [address] [phone]  [facility] 08/19/2019, 8:54 AM

## 2019-08-19 NOTE — Discharge Instructions (Signed)
Sickle Cell Anemia, Adult ° °Sickle cell anemia is a condition where your red blood cells are shaped like sickles. Red blood cells carry oxygen through the body. Sickle-shaped cells do not live as long as normal red blood cells. They also clump together and block blood from flowing through the blood vessels. This prevents the body from getting enough oxygen. Sickle cell anemia causes organ damage and pain. It also increases the risk of infection. °Follow these instructions at home: °Medicines °· Take over-the-counter and prescription medicines only as told by your doctor. °· If you were prescribed an antibiotic medicine, take it as told by your doctor. Do not stop taking the antibiotic even if you start to feel better. °· If you develop a fever, do not take medicines to lower the fever right away. Tell your doctor about the fever. °Managing pain, stiffness, and swelling °· Try these methods to help with pain: °? Use a heating pad. °? Take a warm bath. °? Distract yourself, such as by watching TV. °Eating and drinking °· Drink enough fluid to keep your pee (urine) clear or pale yellow. Drink more in hot weather and during exercise. °· Limit or avoid alcohol. °· Eat a healthy diet. Eat plenty of fruits, vegetables, whole grains, and lean protein. °· Take vitamins and supplements as told by your doctor. °Traveling °· When traveling, keep these with you: °? Your medical information. °? The names of your doctors. °? Your medicines. °· If you need to take an airplane, talk to your doctor first. °Activity °· Rest often. °· Avoid exercises that make your heart beat much faster, such as jogging. °General instructions °· Do not use products that have nicotine or tobacco, such as cigarettes and e-cigarettes. If you need help quitting, ask your doctor. °· Consider wearing a medical alert bracelet. °· Avoid being in high places (high altitudes), such as mountains. °· Avoid very hot or cold temperatures. °· Avoid places where the  temperature changes a lot. °· Keep all follow-up visits as told by your doctor. This is important. °Contact a doctor if: °· A joint hurts. °· Your feet or hands hurt or swell. °· You feel tired (fatigued). °Get help right away if: °· You have symptoms of infection. These include: °? Fever. °? Chills. °? Being very tired. °? Irritability. °? Poor eating. °? Throwing up (vomiting). °· You feel dizzy or faint. °· You have new stomach pain, especially on the left side. °· You have a an erection (priapism) that lasts more than 4 hours. °· You have numbness in your arms or legs. °· You have a hard time moving your arms or legs. °· You have trouble talking. °· You have pain that does not go away when you take medicine. °· You are short of breath. °· You are breathing fast. °· You have a long-term cough. °· You have pain in your chest. °· You have a bad headache. °· You have a stiff neck. °· Your stomach looks bloated even though you did not eat much. °· Your skin is pale. °· You suddenly cannot see well. °Summary °· Sickle cell anemia is a condition where your red blood cells are shaped like sickles. °· Follow your doctor's advice on ways to manage pain, food to eat, activities to do, and steps to take for safe travel. °· Get medical help right away if you have any signs of infection, such as a fever. °This information is not intended to replace advice given to you by   your health care provider. Make sure you discuss any questions you have with your health care provider. °Document Released: 07/09/2013 Document Revised: 01/10/2019 Document Reviewed: 10/24/2016 °Elsevier Patient Education © 2020 [employer] ° °

## 2019-08-23 LAB — CULTURE, BLOOD (ROUTINE X 2)
Culture: NO GROWTH
Culture: NO GROWTH
Special Requests: ADEQUATE

## 2019-08-25 ENCOUNTER — Encounter: Payer: Self-pay | Admitting: Family Medicine

## 2019-08-25 ENCOUNTER — Telehealth: Payer: Self-pay

## 2019-08-25 NOTE — Telephone Encounter (Signed)
I have faxed letter to [place] today 08/25/2019 @8 :40am. Thanks!

## 2019-08-25 NOTE — Telephone Encounter (Signed)
-----   Message from Adelina Mings, LPN sent at [phone]  3:43 PM EST ----- Fax work note on Monday to "[facility]" Fax number [phone].

## 2019-09-16 ENCOUNTER — Ambulatory Visit: Payer: Medicaid Other | Admitting: Family Medicine

## 2019-11-05 ENCOUNTER — Other Ambulatory Visit: Payer: Medicaid Other

## 2020-02-10 ENCOUNTER — Ambulatory Visit: Payer: Medicaid Other | Admitting: Family Medicine

## 2020-06-11 IMAGING — CR DG CHEST 2V
2 series · 2 of 2 positions shown · non-contrast
Comparison: None

CLINICAL DATA: Nonproductive cough for 5 days, hard to take a deep
breath, 86% oxygen saturation on room air, history sickle cell
anemia

EXAM:
CHEST - 2 VIEW

[w chest pa]
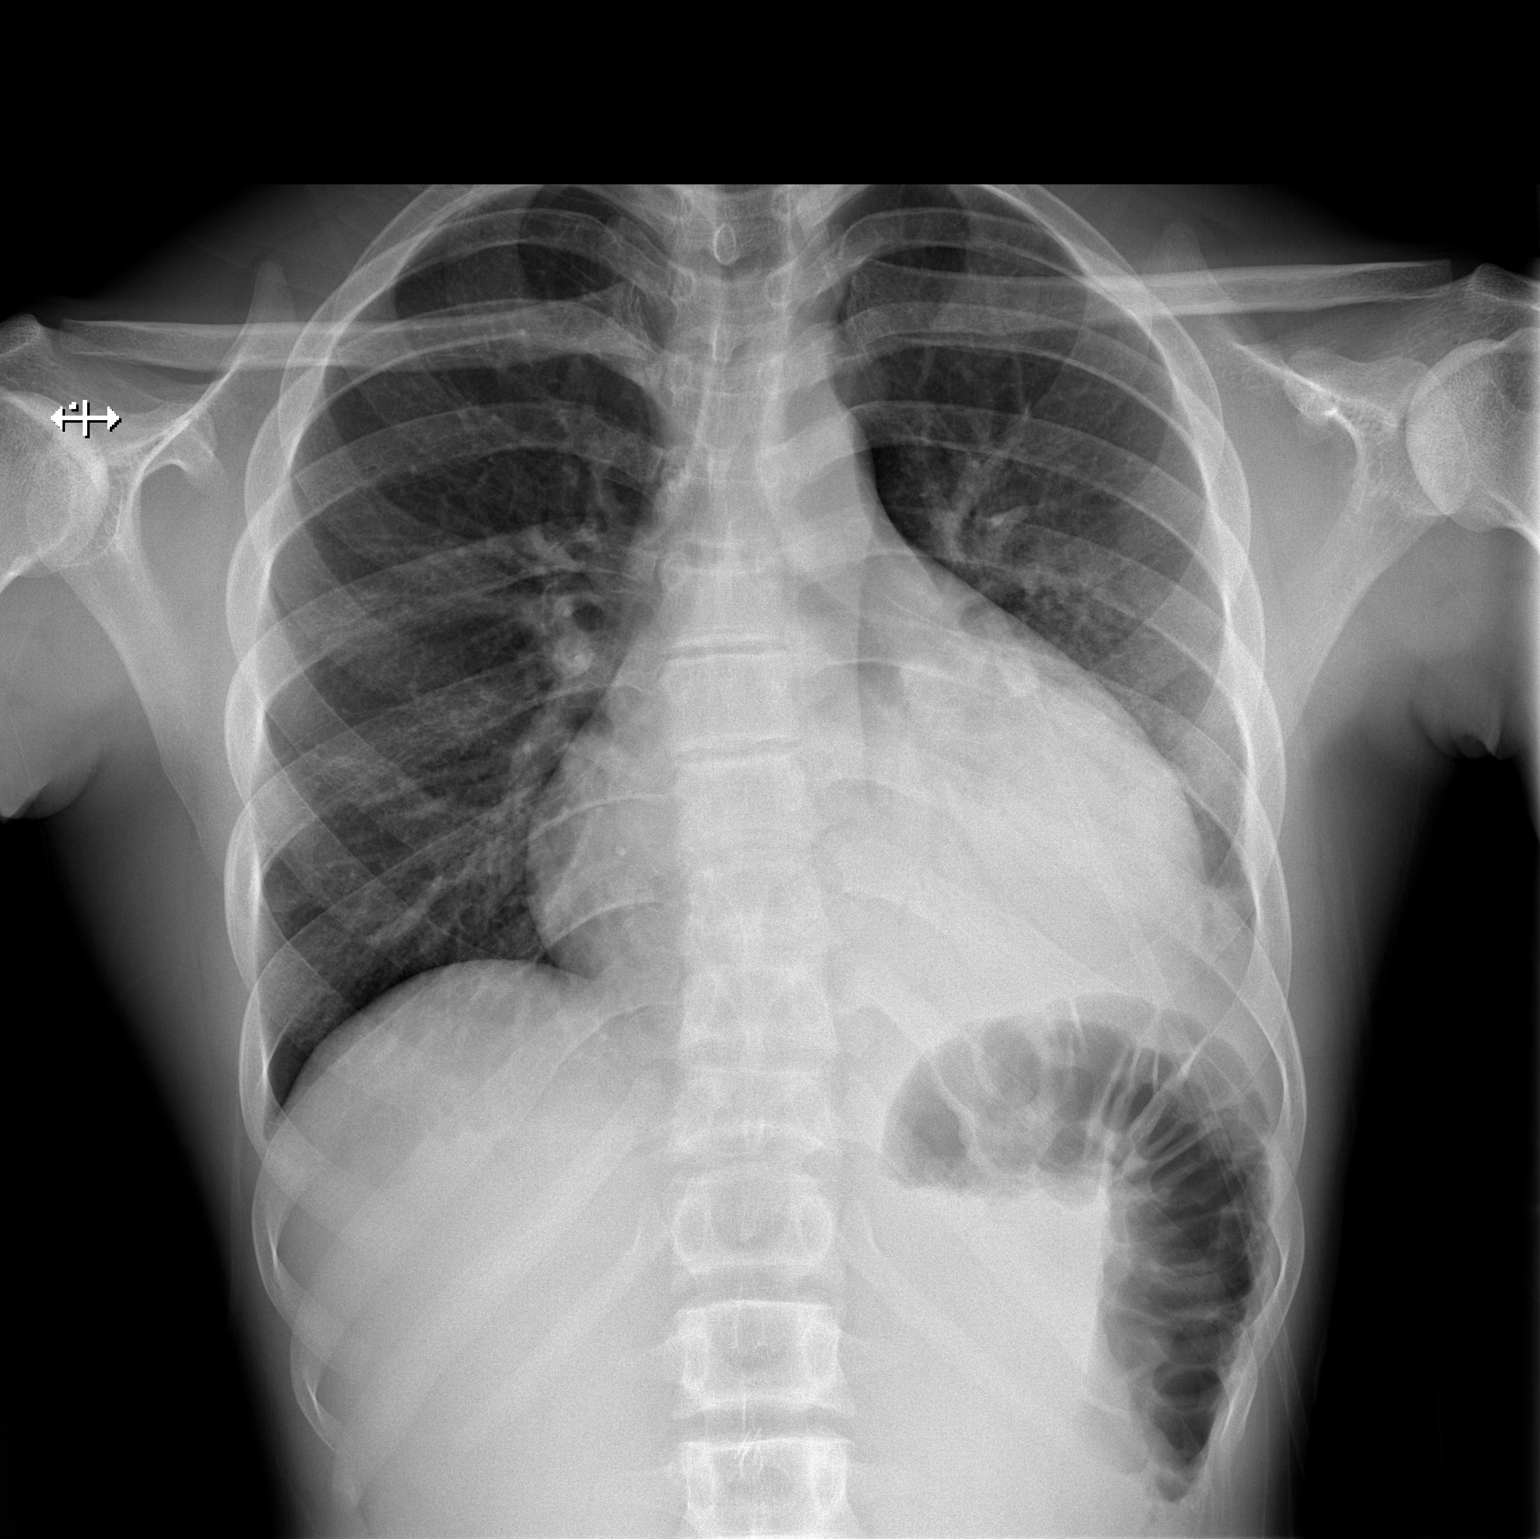

[w chest lat]
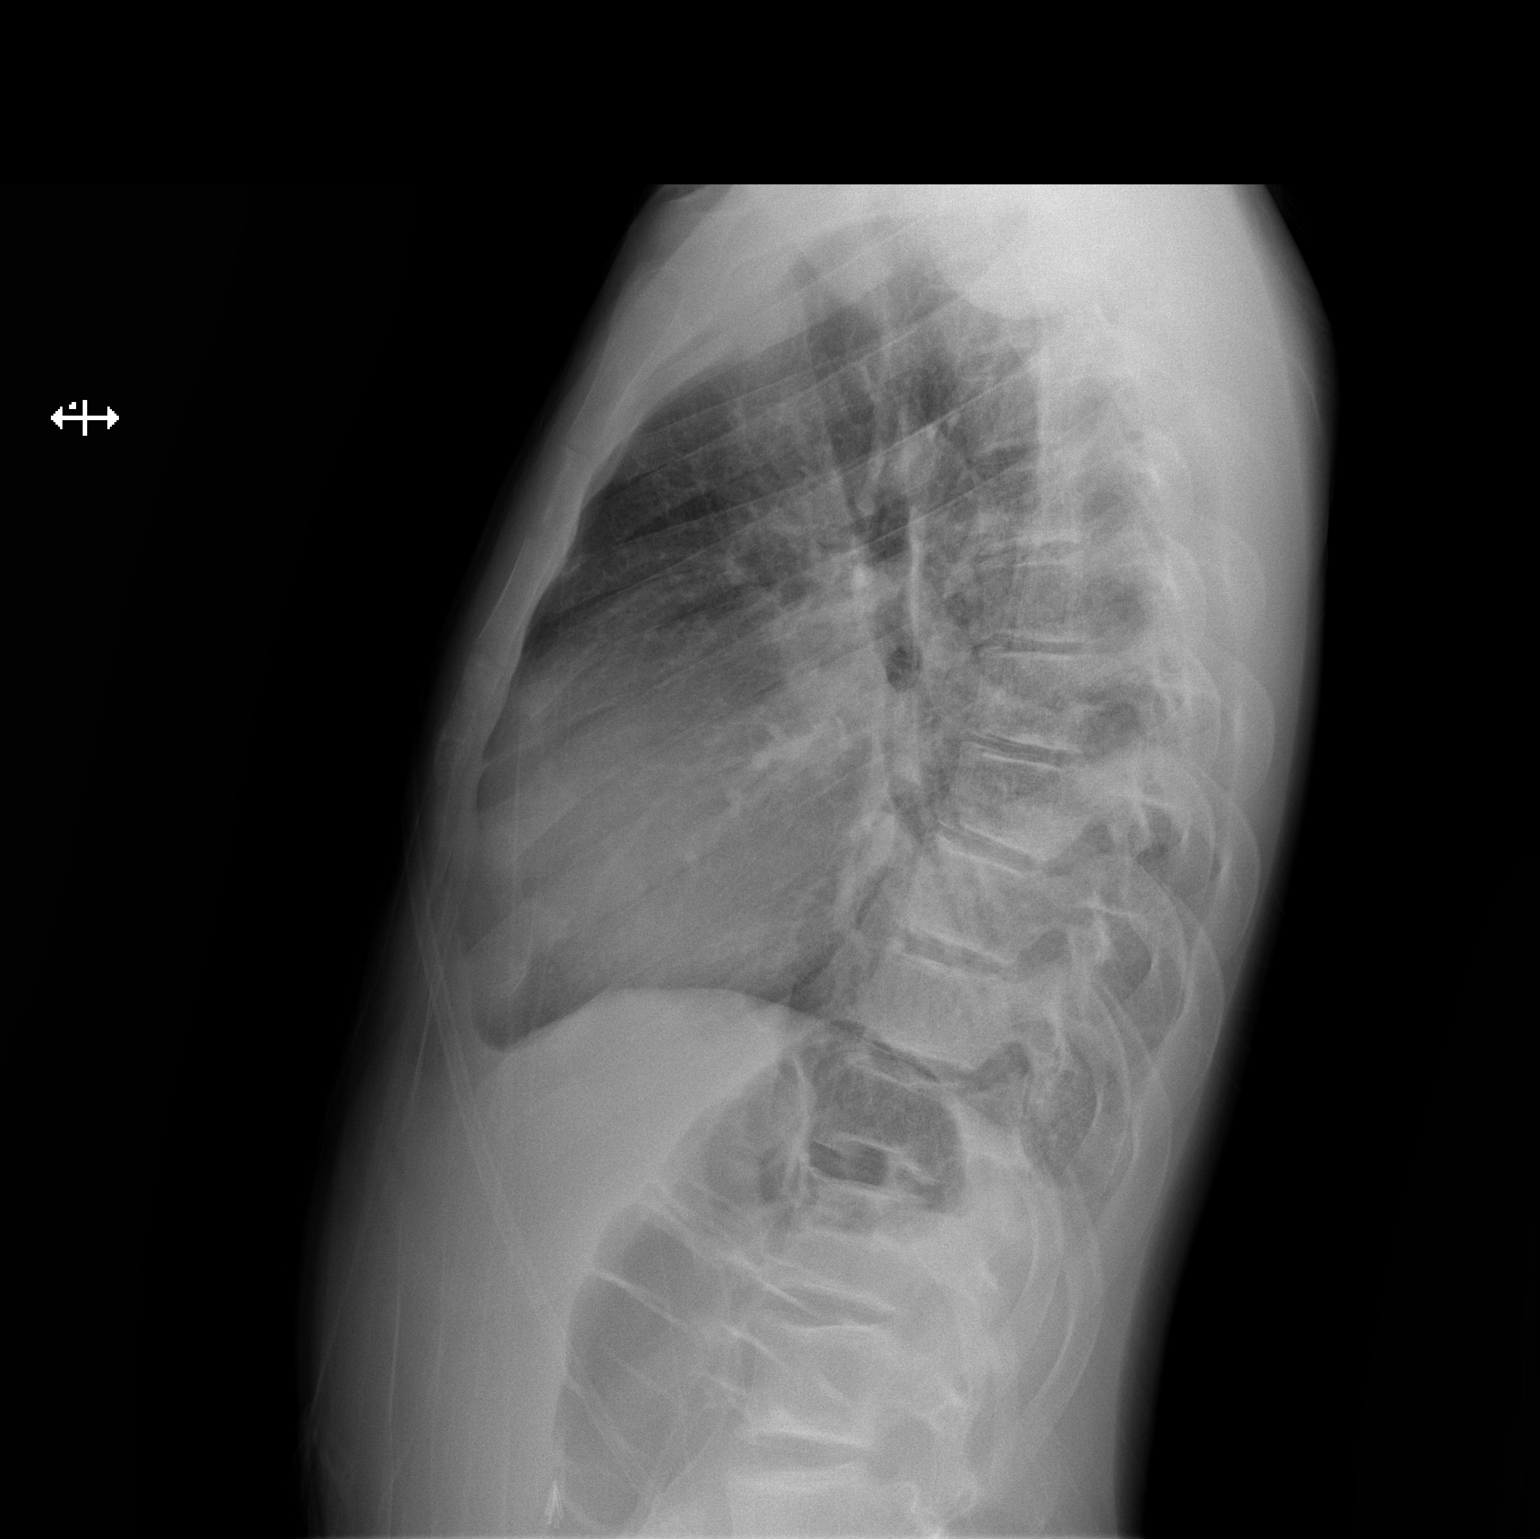

[2 of 2 positions shown; findings below may reference images not displayed]

FINDINGS: Enlargement of cardiac silhouette with pulmonary vascular congestion
consistent with sickle cell disease.

Mediastinal contours normal.

LEFT lower lobe consolidation consistent with pneumonia.

Remaining lungs clear.

No pleural effusion or pneumothorax.
IMPRESSION: Enlargement of cardiac silhouette with pulmonary vascular congestion
consistent with history of sickle cell disease.

LEFT lower lobe pneumonia.

## 2021-03-12 IMAGING — DX DG CHEST 1V PORT
1 series · 1 of 1 positions shown · non-contrast
Comparison: 11/18/2018

CLINICAL DATA: Sickle cell pain

EXAM:
PORTABLE CHEST 1 VIEW

[chest ap]
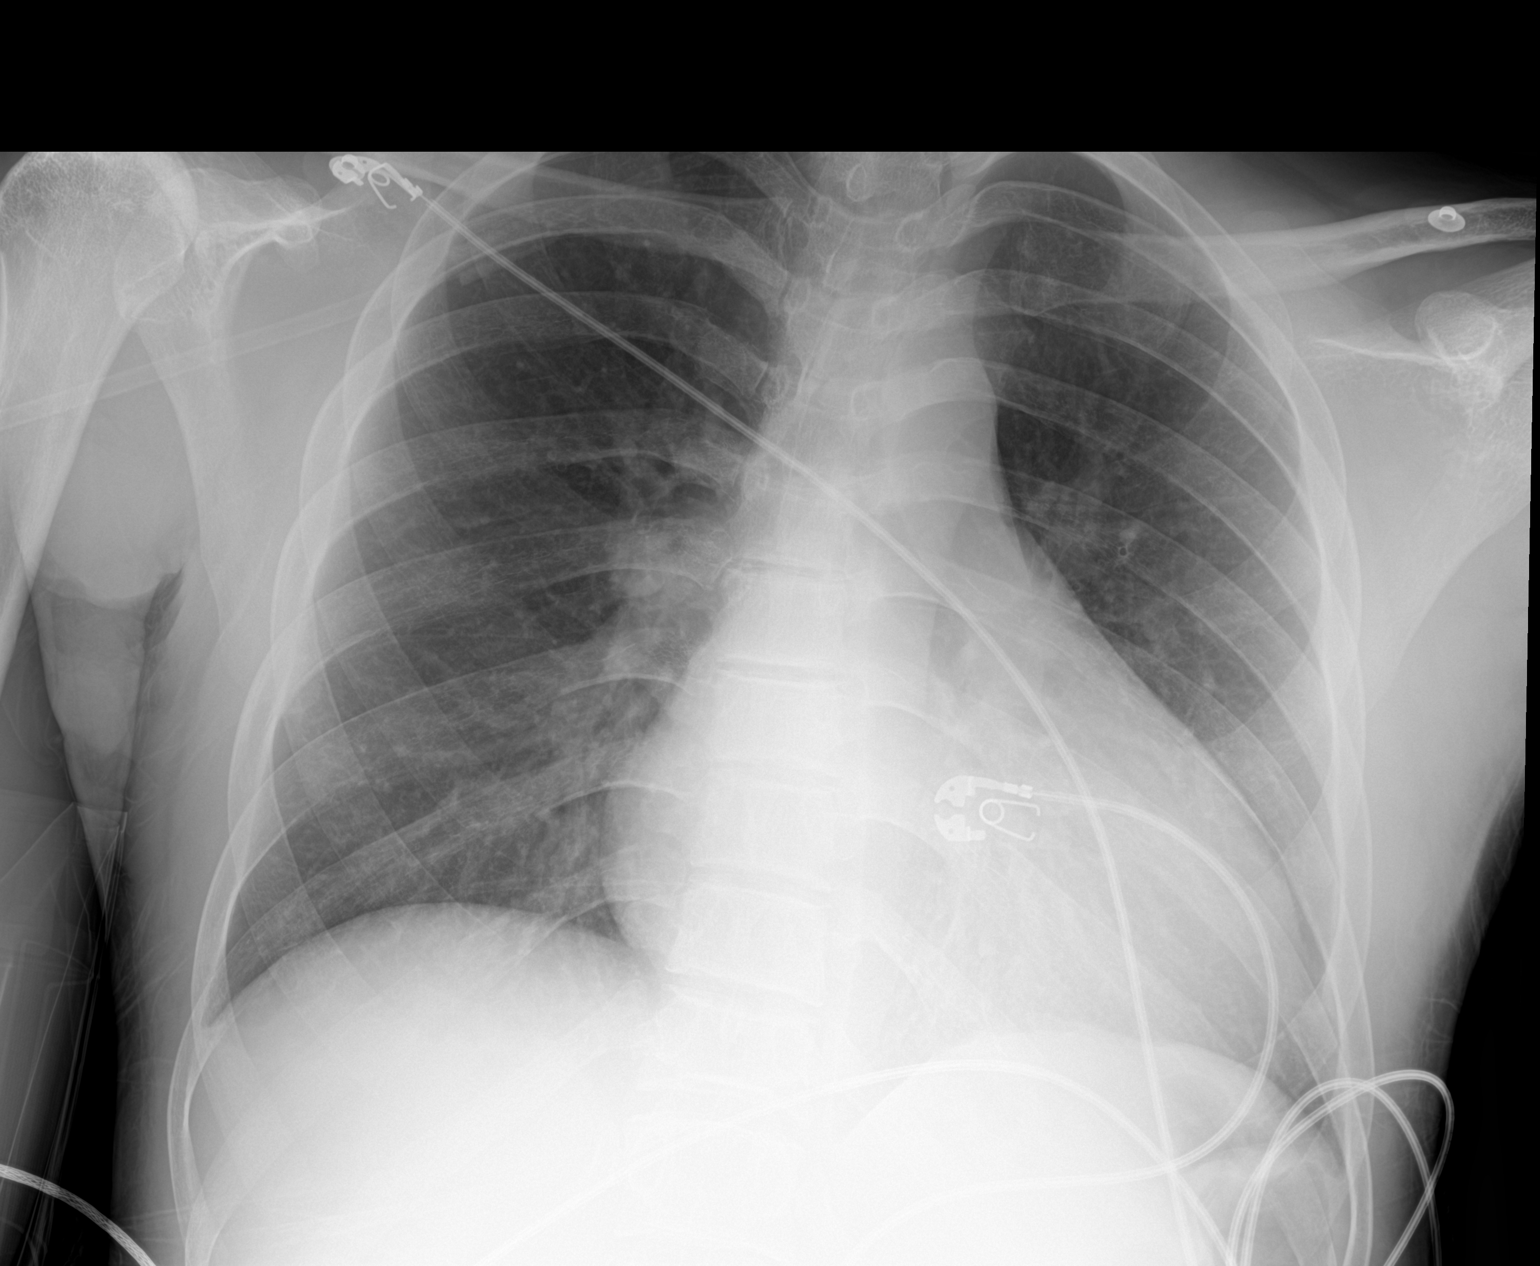

[1 of 1 positions shown; findings below may reference images not displayed]

FINDINGS: Cardiomegaly. No confluent opacities or effusions. No edema. No
acute bony abnormality.
IMPRESSION: Cardiomegaly.  No active disease.

## 2022-08-03 ENCOUNTER — Encounter (HOSPITAL_COMMUNITY): Payer: Self-pay

## 2022-08-03 ENCOUNTER — Emergency Department (HOSPITAL_COMMUNITY): Payer: Self-pay

## 2022-08-03 ENCOUNTER — Inpatient Hospital Stay (HOSPITAL_COMMUNITY)
Admission: EM | Admit: 2022-08-03 | Discharge: 2022-08-07 | DRG: 812 | Disposition: A | Discharge status: Home / Self Care | Payer: Self-pay | Attending: Internal Medicine | Admitting: Internal Medicine

## 2022-08-03 ENCOUNTER — Other Ambulatory Visit: Payer: Self-pay

## 2022-08-03 DIAGNOSIS — Z8249 Family history of ischemic heart disease and other diseases of the circulatory system: Secondary | ICD-10-CM

## 2022-08-03 DIAGNOSIS — Z823 Family history of stroke: Secondary | ICD-10-CM

## 2022-08-03 DIAGNOSIS — Z9109 Other allergy status, other than to drugs and biological substances: Secondary | ICD-10-CM

## 2022-08-03 DIAGNOSIS — D571 Sickle-cell disease without crisis: Secondary | ICD-10-CM | POA: Diagnosis present

## 2022-08-03 DIAGNOSIS — Z833 Family history of diabetes mellitus: Secondary | ICD-10-CM

## 2022-08-03 DIAGNOSIS — Z23 Encounter for immunization: Secondary | ICD-10-CM

## 2022-08-03 DIAGNOSIS — Z881 Allergy status to other antibiotic agents status: Secondary | ICD-10-CM

## 2022-08-03 DIAGNOSIS — D638 Anemia in other chronic diseases classified elsewhere: Secondary | ICD-10-CM | POA: Diagnosis present

## 2022-08-03 DIAGNOSIS — Z91010 Allergy to peanuts: Secondary | ICD-10-CM

## 2022-08-03 DIAGNOSIS — Z832 Family history of diseases of the blood and blood-forming organs and certain disorders involving the immune mechanism: Secondary | ICD-10-CM

## 2022-08-03 DIAGNOSIS — D5700 Hb-SS disease with crisis, unspecified: Principal | ICD-10-CM | POA: Diagnosis present

## 2022-08-03 DIAGNOSIS — D72829 Elevated white blood cell count, unspecified: Secondary | ICD-10-CM | POA: Diagnosis present

## 2022-08-03 DIAGNOSIS — E860 Dehydration: Secondary | ICD-10-CM | POA: Diagnosis present

## 2022-08-03 LAB — COMPREHENSIVE METABOLIC PANEL
ALT: 18 U/L (ref 0–44)
AST: 50 U/L — ABNORMAL HIGH (ref 15–41)
Albumin: 4.5 g/dL (ref 3.5–5.0)
Alkaline Phosphatase: 45 U/L (ref 38–126)
Anion gap: 9 (ref 5–15)
BUN: 7 mg/dL (ref 6–20)
CO2: 23 mmol/L (ref 22–32)
Calcium: 8.9 mg/dL (ref 8.9–10.3)
Chloride: 106 mmol/L (ref 98–111)
Creatinine, Ser: 0.69 mg/dL (ref 0.61–1.24)
GFR, Estimated: >60 mL/min (ref >60)
Glucose, Bld: 100 mg/dL — ABNORMAL HIGH (ref 70–99)
Potassium: 4.4 mmol/L (ref 3.5–5.1)
Sodium: 138 mmol/L (ref 135–145)
Total Bilirubin: 3.8 mg/dL — ABNORMAL HIGH (ref 0.3–1.2)
Total Protein: 8.8 g/dL — ABNORMAL HIGH (ref 6.5–8.1)

## 2022-08-03 LAB — CBC WITH DIFFERENTIAL/PLATELET
Abs Immature Granulocytes: 0.4 10*3/uL — ABNORMAL HIGH (ref 0.00–0.07)
Basophils Absolute: 0.1 10*3/uL (ref 0.0–0.1)
Basophils Relative: 0 %
Eosinophils Absolute: 0 10*3/uL (ref 0.0–0.5)
Eosinophils Relative: 0 %
HCT: 21.9 % — ABNORMAL LOW (ref 39.0–52.0)
Hemoglobin: 7.9 g/dL — ABNORMAL LOW (ref 13.0–17.0)
Immature Granulocytes: 2 %
Lymphocytes Relative: 16 %
Lymphs Abs: 3.6 10*3/uL (ref 0.7–4.0)
MCH: 27 pg (ref 26.0–34.0)
MCHC: 36.1 g/dL — ABNORMAL HIGH (ref 30.0–36.0)
MCV: 74.7 fL — ABNORMAL LOW (ref 80.0–100.0)
Monocytes Absolute: 1.2 10*3/uL — ABNORMAL HIGH (ref 0.1–1.0)
Monocytes Relative: 5 %
Neutro Abs: 17.8 10*3/uL — ABNORMAL HIGH (ref 1.7–7.7)
Neutrophils Relative %: 77 %
Platelets: 429 10*3/uL — ABNORMAL HIGH (ref 150–400)
RBC: 2.93 MIL/uL — ABNORMAL LOW (ref 4.22–5.81)
RDW: 26.3 % — ABNORMAL HIGH (ref 11.5–15.5)
WBC: 23 10*3/uL — ABNORMAL HIGH (ref 4.0–10.5)
nRBC: 0.3 % — ABNORMAL HIGH (ref 0.0–0.2)

## 2022-08-03 LAB — RETICULOCYTES
Immature Retic Fract: 47.7 % — ABNORMAL HIGH (ref 2.3–15.9)
RBC.: 3.1 MIL/uL — ABNORMAL LOW (ref 4.22–5.81)
Retic Count, Absolute: 170.5 10*3/uL (ref 19.0–186.0)
Retic Ct Pct: 5.5 % — ABNORMAL HIGH (ref 0.4–3.1)

## 2022-08-03 MED ORDER — HYDROMORPHONE HCL 1 MG/ML IJ SOLN
1.0000 mg | Freq: Once | INTRAMUSCULAR | Status: AC
  Administered 2022-08-03: 1 mg via INTRAVENOUS
  Filled 2022-08-03: qty 1

## 2022-08-03 MED ORDER — ENOXAPARIN SODIUM 40 MG/0.4ML IJ SOSY
40.0000 mg | PREFILLED_SYRINGE | INTRAMUSCULAR | Status: DC
  Administered 2022-08-05 – 2022-08-07 (×3): 40 mg via SUBCUTANEOUS
  Filled 2022-08-03 (×4): qty 0.4

## 2022-08-03 MED ORDER — POLYETHYLENE GLYCOL 3350 17 G PO PACK: 17.0000 g | PACK | Freq: Every day | ORAL | Status: DC | PRN

## 2022-08-03 MED ORDER — DIPHENHYDRAMINE HCL 25 MG PO CAPS: 25.0000 mg | ORAL_CAPSULE | ORAL | Status: DC | PRN

## 2022-08-03 MED ORDER — ONDANSETRON 4 MG PO TBDP: 4.0000 mg | ORAL_TABLET | Freq: Once | ORAL | Status: DC

## 2022-08-03 MED ORDER — HYDROMORPHONE HCL 2 MG/ML IJ SOLN: 2.0000 mg | INTRAMUSCULAR | Status: DC | PRN

## 2022-08-03 MED ORDER — HYDROMORPHONE HCL 2 MG PO TABS
2.0000 mg | ORAL_TABLET | Freq: Once | ORAL | Status: AC
  Administered 2022-08-03: 2 mg via ORAL
  Filled 2022-08-03: qty 1

## 2022-08-03 MED ORDER — KETOROLAC TROMETHAMINE 15 MG/ML IJ SOLN
15.0000 mg | Freq: Four times a day (QID) | INTRAMUSCULAR | Status: DC
  Administered 2022-08-03 – 2022-08-07 (×16): 15 mg via INTRAVENOUS
  Filled 2022-08-03 (×16): qty 1

## 2022-08-03 MED ORDER — HYDROMORPHONE 1 MG/ML IV SOLN
INTRAVENOUS | Status: DC
  Administered 2022-08-04: 1 mg via INTRAVENOUS
  Administered 2022-08-04: 2.5 mg via INTRAVENOUS
  Administered 2022-08-04 (×2): 1 mg via INTRAVENOUS
  Administered 2022-08-04: 4.5 mg via INTRAVENOUS
  Administered 2022-08-04: 3 mg via INTRAVENOUS
  Administered 2022-08-05: 5 mg via INTRAVENOUS
  Filled 2022-08-03: qty 30

## 2022-08-03 MED ORDER — SENNOSIDES-DOCUSATE SODIUM 8.6-50 MG PO TABS
1.0000 | ORAL_TABLET | Freq: Two times a day (BID) | ORAL | Status: DC
  Administered 2022-08-04 – 2022-08-07 (×6): 1 via ORAL
  Filled 2022-08-03 (×7): qty 1

## 2022-08-03 MED ORDER — FENTANYL CITRATE PF 50 MCG/ML IJ SOSY
25.0000 ug | PREFILLED_SYRINGE | Freq: Once | INTRAMUSCULAR | Status: AC
  Administered 2022-08-03: 25 ug via INTRAVENOUS
  Filled 2022-08-03: qty 1

## 2022-08-03 MED ORDER — HYDROMORPHONE HCL 1 MG/ML IJ SOLN: 0.5000 mg | INTRAMUSCULAR | Status: DC

## 2022-08-03 MED ORDER — ONDANSETRON HCL 4 MG/2ML IJ SOLN
4.0000 mg | Freq: Four times a day (QID) | INTRAMUSCULAR | Status: DC | PRN
  Administered 2022-08-04 – 2022-08-06 (×5): 4 mg via INTRAVENOUS
  Filled 2022-08-03 (×6): qty 2

## 2022-08-03 MED ORDER — HYDROMORPHONE HCL 1 MG/ML IJ SOLN
0.5000 mg | INTRAMUSCULAR | Status: DC
  Filled 2022-08-03: qty 1

## 2022-08-03 MED ORDER — SODIUM CHLORIDE 0.45 % IV SOLN: INTRAVENOUS | Status: DC

## 2022-08-03 MED ORDER — SODIUM CHLORIDE 0.9% FLUSH: 9.0000 mL | INTRAVENOUS | Status: DC | PRN

## 2022-08-03 MED ORDER — NALOXONE HCL 0.4 MG/ML IJ SOLN: 0.4000 mg | INTRAMUSCULAR | Status: DC | PRN

## 2022-08-03 NOTE — ED Triage Notes (Signed)
Pt c/o sickle cell crisis starting a few hours ago. Pt c/o generalized pain all over, especially legs bilaterally. Pt states he did not have any oxycodone at home and tylenol did not help.

## 2022-08-03 NOTE — Assessment & Plan Note (Signed)
Hemoglobin stable 7.9 with baseline around 7-8. -He does note chest pain but hemoglobin is stable with negative chest x-ray.  We will continue to monitor.

## 2022-08-03 NOTE — Assessment & Plan Note (Signed)
Hemoglobin stable 7.9 with baseline around 7-8. -He does note chest pain but hemoglobin is stable with negative chest x-ray.  We will continue to monitor. 

## 2022-08-03 NOTE — ED Provider Triage Note (Signed)
Emergency Medicine Provider Triage Evaluation Note  William Gallegos , a 21 y.o. male  was evaluated in triage.  Pt complains of generalized pain. Hx of sickle cell, developed generalized pain today including legs and chest.  NO fever, sob, cough, n/v/d.  Felt to be due to weather changes. Ran out of his oxycodone.  Last crisis was several years ago  Review of Systems  Positive: As above Negative: As above  Physical Exam  BP (!) 145/79 (BP Location: Left Arm)   Pulse (!) 112   Temp 98.5 F (36.9 C) (Oral)   Resp 20   SpO2 97%  Gen:   Awake, no distress   Resp:  Normal effort  MSK:   Moves extremities without difficulty  Other:    Medical Decision Making  Medically screening exam initiated at 3:18 PM.  Appropriate orders placed.  William Gallegos was informed that the remainder of the evaluation will be completed by another provider, this initial triage assessment does not replace that evaluation, and the importance of remaining in the ED until their evaluation is complete.     William Moras, PA-C 08/03/22 [phone]

## 2022-08-03 NOTE — Assessment & Plan Note (Signed)
WBC of 23.  Suspect secondary to sickle cell pain crisis.  No obvious source of infection.

## 2022-08-03 NOTE — H&P (Signed)
History and Physical    Patient: William Gallegos FBP:[id number] DOB: 2001/03/21 DOA: 08/03/2022 DOS: the patient was seen and examined on 08/03/2022 PCP: Massie Maroon, FNP  Patient coming from: Home  Chief Complaint:  Chief Complaint  Patient presents with   Sickle Cell Pain Crisis   HPI: William Gallegos is a 21 y.o. male with medical history significant of Sickle cell disease type SS, acute chest syndrome who presents with concerns of leg pain and chest pain.  Patient reports that he has been lost to follow-up with hematology for at years.  Has been out of his pain medication.  He reports leg pain and chest pain.  Denies any shortness of breath, cough or fever.  No nausea, vomiting abdominal pain or diarrhea. Has not been hospitalized for sickle cell pain crisis in several years.  In the ED, he was afebrile and normotensive.  Hemoglobin is stable at 7.9 with his baseline around 7-8. Platelet of 429  WBC 23  Creatinine stable at 0.69  Chest x-ray was negative for any pneumonia.  He was given a total of 1 mg Dilaudid in the ED but hospitalist was consulted for uncontrolled sickle cell crisis pain.  Review of Systems: As mentioned in the history of present illness. All other systems reviewed and are negative. Past Medical History:  Diagnosis Date   Acute chest syndrome (HCC)    Seasonal allergies    Sickle cell anemia (HCC)    Past Surgical History:  Procedure Laterality Date   adnoidectomy     CHOLECYSTECTOMY     TONSILLECTOMY AND ADENOIDECTOMY     Social History:  reports that he has never smoked. He has never used smokeless tobacco. He reports that he does not drink alcohol and does not use drugs.  Allergies  Allergen Reactions   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 and Swelling    Swelling of face    Peanuts [Peanut Oil] Anaphylaxis and Swelling    Swelling of face    Erythromycin Hives   Other Swelling    Raisins--- facial swelling Mushrooms---face swelling     Family History  Problem Relation Age of Onset   Hypertension Other    Diabetes Other    Cancer Other    Stroke Other    Sickle cell anemia Brother    Sickle cell anemia Maternal Grandfather     Prior to Admission medications   Medication Sig Start Date End Date Taking? Authorizing Provider  oxyCODONE-acetaminophen (PERCOCET/ROXICET) 5-325 MG tablet Take 1 tablet by mouth every 6 (six) hours as needed for moderate pain. 08/19/19  Yes Massie Maroon, FNP  folic acid (FOLVITE) 1 MG tablet Take 1 tablet (1 mg total) by mouth daily. Patient not taking: Reported on 08/03/2022 08/05/19   Massie Maroon, FNP  hydroxyurea (HYDREA) 500 MG capsule Take 1 capsule (500 mg total) by mouth daily. TAKE 3 CAPSULES (1,500 MG TOTAL) BY MOUTH DAILY. Patient not taking: Reported on 08/03/2022 08/05/19   Massie Maroon, FNP  lisinopril (ZESTRIL) 2.5 MG tablet Take 1 tablet (2.5 mg total) by mouth daily. Patient not taking: Reported on 08/03/2022 08/05/19   Massie Maroon, FNP    Physical Exam: Vitals:   08/03/22 1830 08/03/22 1900 08/03/22 2200 08/03/22 2351  BP: 109/72 128/78  129/71  Pulse: 96 88  84  Resp: (!) 24 20  18   Temp:  98.8 F (37.1 C) 99.2 F (37.3 C) 98.9 F (37.2 C)  TempSrc:  Oral Oral Oral  SpO2: 98%  95%  98%  Weight:  77.1 kg    Height:  5\' 8"  (1.727 m)     Constitutional: NAD, young male laying in bed restless and uncomfortable due to pain Eyes: lids and conjunctivae normal ENMT: Mucous membranes are moist. Neck: normal, supple Respiratory: clear to auscultation bilaterally, no wheezing, no crackles. Normal respiratory effort. No accessory muscle use.  Cardiovascular: Regular rate and rhythm, no murmurs / rubs / gallops. No extremity edema.   Abdomen: Soft, nontender nondistended.. Bowel sounds positive.  Musculoskeletal: no clubbing / cyanosis. No joint deformity upper and lower extremities. Good ROM, no contractures. Normal muscle tone.  Skin: no rashes,  lesions, ulcers. No induration Neurologic: CN 2-12 grossly intact.  Strength 5/5 in all 4.  Psychiatric: Normal judgment and insight. Alert and oriented x 3. Normal mood. Data Reviewed:  See HPI  Assessment and Plan: * Sickle cell crisis ([place]) Sickle cell pain crisis Continues 0.45% IV fluids  IV Dilaudid via PCA with settings of 0.5mg , 10-minute lockout with max of 1mg /hr Toradol 15mg  q6HR Monitor vital signs closely and re-evaluate pain scale    Leukocytosis WBC of 23.  Suspect secondary to sickle cell pain crisis.  No obvious source of infection.  Sickle cell anemia (HCC) Hemoglobin stable 7.9 with baseline around 7-8. -He does note chest pain but hemoglobin is stable with negative chest x-ray.  We will continue to monitor.      Advance Care Planning:   Code Status: Full Code   Consults: none  Family Communication: Discussed with brother at bedside  Severity of Illness: The appropriate patient status for this patient is OBSERVATION. Observation status is judged to be reasonable and necessary in order to provide the required intensity of service to ensure the patient's safety. The patient's presenting symptoms, physical exam findings, and initial radiographic and laboratory data in the context of their medical condition is felt to place them at decreased risk for further clinical deterioration. Furthermore, it is anticipated that the patient will be medically stable for discharge from the hospital within 2 midnights of admission.   Author: Orene Desanctis, DO 08/03/2022 11:59 PM  For on call review [email or web address].

## 2022-08-03 NOTE — ED Provider Notes (Signed)
[facility] DEPT Provider Note   CSN: [id number] Arrival date & time: 08/03/22  1444     History  Chief Complaint  Patient presents with   Sickle Cell Pain Crisis    William Gallegos is a 21 y.o. male.  Patient presents to the hospital complaining of a sickle cell pain crisis which began a few hours prior to arrival.  Patient complains mostly of pain in bilateral legs and chest.  Patient states he has been out of his home medication regimen for some time and that Tylenol did not help at all.  He denies shortness of breath, abdominal pain, urinary symptoms, headache at this time.  Past medical history significant for sickle cell anemia, history of acute chest syndrome  HPI     Home Medications Prior to Admission medications   Medication Sig Start Date End Date Taking? Authorizing Provider  ergocalciferol (VITAMIN D2) 1.25 MG (50000 UT) capsule Take 1 capsule (50,000 Units total) by mouth once a week. 08/06/19   Dorena Dew, FNP  folic acid (FOLVITE) 1 MG tablet Take 1 tablet (1 mg total) by mouth daily. 08/05/19   Dorena Dew, FNP  hydroxyurea (HYDREA) 500 MG capsule Take 1 capsule (500 mg total) by mouth daily. TAKE 3 CAPSULES (1,500 MG TOTAL) BY MOUTH DAILY. Patient taking differently: Take 1,500 mg by mouth daily.  08/05/19   Dorena Dew, FNP  ibuprofen (ADVIL) 800 MG tablet Take 1 tablet (800 mg total) by mouth every 8 (eight) hours as needed. 08/19/19   Dorena Dew, FNP  lisinopril (ZESTRIL) 2.5 MG tablet Take 1 tablet (2.5 mg total) by mouth daily. 08/05/19   Dorena Dew, FNP  oxyCODONE-acetaminophen (PERCOCET/ROXICET) 5-325 MG tablet Take 1 tablet by mouth every 6 (six) hours as needed for moderate pain. 08/19/19   Dorena Dew, FNP      Allergies    Carrot [daucus carota], Peanuts [peanut oil], Erythromycin, and Other    Review of Systems   Review of Systems  Respiratory:  Negative for shortness of breath.    Cardiovascular:  Positive for chest pain.  Gastrointestinal:  Negative for abdominal pain.  Musculoskeletal:  Positive for arthralgias.    Physical Exam Updated Vital Signs BP 128/78 (BP Location: Right Arm)   Pulse 88   Temp 98.8 F (37.1 C) (Oral)   Resp 20   Ht 5\' 8"  (1.727 m)   Wt 77.1 kg   SpO2 95%   BMI 25.85 kg/m  Physical Exam Vitals and nursing note reviewed.  Constitutional:      General: He is not in acute distress.    Appearance: He is well-developed.  HENT:     Head: Normocephalic and atraumatic.  Eyes:     Conjunctiva/sclera: Conjunctivae normal.  Cardiovascular:     Rate and Rhythm: Normal rate and regular rhythm.     Heart sounds: No murmur heard. Pulmonary:     Effort: Pulmonary effort is normal. No respiratory distress.     Breath sounds: Normal breath sounds.  Abdominal:     Palpations: Abdomen is soft.     Tenderness: There is no abdominal tenderness.  Musculoskeletal:        General: No swelling or tenderness.     Cervical back: Neck supple.  Skin:    General: Skin is warm and dry.     Capillary Refill: Capillary refill takes less than 2 seconds.  Neurological:     Mental Status: He is alert.  Psychiatric:        Mood and Affect: Mood normal.     ED Results / Procedures / Treatments   Labs (all labs ordered are listed, but only abnormal results are displayed) Labs Reviewed  COMPREHENSIVE METABOLIC PANEL - Abnormal; Notable for the following components:      Result Value   Glucose, Bld 100 (*)    Total Protein 8.8 (*)    AST 50 (*)    Total Bilirubin 3.8 (*)    All other components within normal limits  CBC WITH DIFFERENTIAL/PLATELET - Abnormal; Notable for the following components:   WBC 23.0 (*)    RBC 2.93 (*)    Hemoglobin 7.9 (*)    HCT 21.9 (*)    MCV 74.7 (*)    MCHC 36.1 (*)    RDW 26.3 (*)    Platelets 429 (*)    nRBC 0.3 (*)    Neutro Abs 17.8 (*)    Monocytes Absolute 1.2 (*)    Abs Immature Granulocytes 0.40 (*)     All other components within normal limits  RETICULOCYTES - Abnormal; Notable for the following components:   Retic Ct Pct 5.5 (*)    RBC. 3.10 (*)    Immature Retic Fract 47.7 (*)    All other components within normal limits    EKG None  Radiology DG Chest 2 View  Result Date: 08/03/2022 CLINICAL DATA:  Chest pain. Sickle cell crisis starting a few hours ago. EXAM: CHEST - 2 VIEW COMPARISON:  AP chest 08/17/2019, chest two views 11/18/2018 FINDINGS: Cardiac silhouette is again mildly enlarged. Mediastinal contours are within normal limits. The lungs are clear. No pleural effusion or pneumothorax. There is an H shape of a lower thoracic vertebral body, similar to prior, consistent with sequela of sickle cell. Upper abdominal surgical clips are seen on lateral view, unchanged from prior. IMPRESSION: 1. No acute cardiopulmonary process. 2. Chronic lower thoracic vertebral body changes of sickle cell. Electronically Signed   By: Neita Garnet M.D.   On: 08/03/2022 15:56    Procedures Procedures    Medications Ordered in ED Medications  ondansetron (ZOFRAN-ODT) disintegrating tablet 4 mg (0 mg Oral Hold 08/03/22 1815)  0.45 % sodium chloride infusion ( Intravenous New Bag/Given 08/03/22 2054)  HYDROmorphone (DILAUDID) tablet 2 mg (2 mg Oral Given 08/03/22 1749)  HYDROmorphone (DILAUDID) injection 1 mg (1 mg Intravenous Given 08/03/22 1850)  HYDROmorphone (DILAUDID) injection 1 mg (1 mg Intravenous Given 08/03/22 2048)    ED Course/ Medical Decision Making/ A&P                           Medical Decision Making Risk Prescription drug management.   This patient presents to the ED for concern of sickle cell pain crisis, this involves an extensive number of treatment options, and is a complaint that carries with it a high risk of complications and morbidity.  The differential diagnosis includes sickle cell pain crisis, chronic pain syndrome, and others   Co morbidities that complicate the  patient evaluation  History of sickle cell pain crisis, acute chest syndrome   Additional history obtained:  Additional history obtained from family at bedside External records from outside source obtained and reviewed including discharge summary from August 20, 2019 due to his last admission for sickle cell pain crisis   Lab Tests:  I Ordered, and personally interpreted labs.  The pertinent results include: WBC 23, hemoglobin 7.9, creatinine  0.69, RBC 3.10, reticulocyte count 170.5   Imaging Studies ordered:  I ordered imaging studies including chest x-ray I independently visualized and interpreted imaging which showed  1. No acute cardiopulmonary process.  2. Chronic lower thoracic vertebral body changes of sickle cell.   I agree with the radiologist interpretation   Consultations Obtained:  I requested consultation with the hospitalist, Dr.Tu and discussed lab and imaging findings as well as pertinent plan - they recommend: admission for pain control   Problem List / ED Course / Critical interventions / Medication management   I ordered medication including Dilaudid for pain, Zofran for nausea Reevaluation of the patient after these medicines showed that the patient stayed the same I have reviewed the patients home medicines and have made adjustments as needed   Test / Admission - Considered:  The patient continues to have significant pain after multiple doses of Dilaudid.  Discussed with hospitalist who agreed to admit the patient for further pain control        Final Clinical Impression(s) / ED Diagnoses Final diagnoses:  Sickle cell pain crisis [facility])    Rx / DC Orders ED Discharge Orders     None         Pamala Duffel 08/03/22 2147    Arby Barrette, MD 08/24/22 1707

## 2022-08-03 NOTE — Assessment & Plan Note (Signed)
Sickle cell pain crisis Continues 0.45% IV fluids  IV Dilaudid via PCA with settings of 0.5mg , 10-minute lockout with max of 1mg /hr Toradol 15mg  q6HR Monitor vital signs closely and re-evaluate pain scale

## 2022-08-04 LAB — CBC
HCT: 20.3 % — ABNORMAL LOW (ref 39.0–52.0)
Hemoglobin: 7.4 g/dL — ABNORMAL LOW (ref 13.0–17.0)
MCH: 27.5 pg (ref 26.0–34.0)
MCHC: 36.5 g/dL — ABNORMAL HIGH (ref 30.0–36.0)
MCV: 75.5 fL — ABNORMAL LOW (ref 80.0–100.0)
Platelets: 386 10*3/uL (ref 150–400)
RBC: 2.69 MIL/uL — ABNORMAL LOW (ref 4.22–5.81)
RDW: 25.8 % — ABNORMAL HIGH (ref 11.5–15.5)
WBC: 23.4 10*3/uL — ABNORMAL HIGH (ref 4.0–10.5)
nRBC: 0.3 % — ABNORMAL HIGH (ref 0.0–0.2)

## 2022-08-04 LAB — HIV ANTIBODY (ROUTINE TESTING W REFLEX): HIV Screen 4th Generation wRfx: NONREACTIVE

## 2022-08-04 MED ORDER — OXYCODONE HCL 5 MG PO TABS: 5.0000 mg | ORAL_TABLET | ORAL | Status: DC | PRN

## 2022-08-04 MED ORDER — INFLUENZA VAC SPLIT QUAD 0.5 ML IM SUSY
0.5000 mL | PREFILLED_SYRINGE | INTRAMUSCULAR | Status: AC
  Administered 2022-08-05: 0.5 mL via INTRAMUSCULAR
  Filled 2022-08-04: qty 0.5

## 2022-08-04 MED ORDER — ORAL CARE MOUTH RINSE: 15.0000 mL | OROMUCOSAL | Status: DC | PRN

## 2022-08-04 NOTE — Progress Notes (Signed)
Subjective: William Gallegos is a 21 year old male with a medical history significant for sickle cell disease was admitted for sickle cell pain crisis.   Patient continues to complain of pain to chest and lower back. Pain intensity is 7/10. Patient denies shortness of breath, dizziness, headache, nausea, vomiting, or diarrhea.   Objective:  Vital signs in last 24 hours:  Vitals:   08/04/22 0820 08/04/22 0821 08/04/22 1144 08/04/22 1207  BP: 119/81  125/77   Pulse: (!) 59  79   Resp: 19 11 11 12   Temp: (!) 97.4 F (36.3 C)  98.2 F (36.8 C)   TempSrc: Oral  Oral   SpO2: 100% 100% 98% 98%  Weight:      Height:        Intake/Output from previous day:   Intake/Output Summary (Last 24 hours) at 08/04/2022 1342 Last data filed at 08/04/2022 0740 Gross per 24 hour  Intake 935.26 ml  Output 1000 ml  Net -64.74 ml    Physical Exam: General: Alert, awake, oriented x3, in no acute distress.  HEENT: [place]/AT PEERL, EOMI Neck: Trachea midline,  no masses, no thyromegal,y no JVD, no carotid bruit OROPHARYNX:  Moist, No exudate/ erythema/lesions.  Heart: Regular rate and rhythm, without murmurs, rubs, gallops, PMI non-displaced, no heaves or thrills on palpation.  Lungs: Clear to auscultation, no wheezing or rhonchi noted. No increased vocal fremitus resonant to percussion  Abdomen: Soft, nontender, nondistended, positive bowel sounds, no masses no hepatosplenomegaly noted..  Neuro: No focal neurological deficits noted cranial nerves II through XII grossly intact. DTRs 2+ bilaterally upper and lower extremities. Strength 5 out of 5 in bilateral upper and lower extremities. Musculoskeletal: No warm swelling or erythema around joints, no spinal tenderness noted. Psychiatric: Patient alert and oriented x3, good insight and cognition, good recent to remote recall. Lymph node survey: No cervical axillary or inguinal lymphadenopathy noted.  Lab Results:  Basic Metabolic Panel:    Component  Value Date/Time   NA 138 08/03/2022 1751   NA 139 08/05/2019 1004   K 4.4 08/03/2022 1751   CL 106 08/03/2022 1751   CO2 23 08/03/2022 1751   BUN 7 08/03/2022 1751   BUN 4 (L) 08/05/2019 1004   CREATININE 0.69 08/03/2022 1751   CREATININE 0.52 (L) 01/18/2017 1059   GLUCOSE 100 (H) 08/03/2022 1751   CALCIUM 8.9 08/03/2022 1751   CBC:    Component Value Date/Time   WBC 23.4 (H) 08/04/2022 0524   HGB 7.4 (L) 08/04/2022 0524   HGB 8.1 (L) 08/05/2019 1004   HCT 20.3 (L) 08/04/2022 0524   HCT 24.1 (L) 08/05/2019 1004   PLT 386 08/04/2022 0524   PLT 435 08/05/2019 1004   MCV 75.5 (L) 08/04/2022 0524   MCV 86 08/05/2019 1004   NEUTROABS 17.8 (H) 08/03/2022 1751   NEUTROABS 3.8 08/05/2019 1004   LYMPHSABS 3.6 08/03/2022 1751   LYMPHSABS 5.1 (H) 08/05/2019 1004   MONOABS 1.2 (H) 08/03/2022 1751   EOSABS 0.0 08/03/2022 1751   EOSABS 0.4 08/05/2019 1004   BASOSABS 0.1 08/03/2022 1751   BASOSABS 0.1 08/05/2019 1004    No results found for this or any previous visit (from the past 240 hour(s)).  Studies/Results: DG Chest 2 View  Result Date: 08/03/2022 CLINICAL DATA:  Chest pain. Sickle cell crisis starting a few hours ago. EXAM: CHEST - 2 VIEW COMPARISON:  AP chest 08/17/2019, chest two views 11/18/2018 FINDINGS: Cardiac silhouette is again mildly enlarged. Mediastinal contours are within normal  limits. The lungs are clear. No pleural effusion or pneumothorax. There is an H shape of a lower thoracic vertebral body, similar to prior, consistent with sequela of sickle cell. Upper abdominal surgical clips are seen on lateral view, unchanged from prior. IMPRESSION: 1. No acute cardiopulmonary process. 2. Chronic lower thoracic vertebral body changes of sickle cell. Electronically Signed   By: Yvonne Kendall M.D.   On: 08/03/2022 15:56    Medications: Scheduled Meds:  enoxaparin (LOVENOX) injection  40 mg Subcutaneous Q24H   HYDROmorphone   Intravenous Q4H   [START ON 08/05/2022] influenza  vac split quadrivalent PF  0.5 mL Intramuscular Tomorrow-1000   ketorolac  15 mg Intravenous Q6H   senna-docusate  1 tablet Oral BID   Continuous Infusions:  sodium chloride 100 mL/hr at 08/04/22 0629   PRN Meds:.diphenhydrAMINE, naloxone **AND** sodium chloride flush, ondansetron (ZOFRAN) IV, mouth rinse, oxyCODONE, polyethylene glycol  Consultants: none  Procedures: none  Antibiotics: none  Assessment/Plan: Principal Problem:   Sickle cell crisis (HCC) Active Problems:   Sickle cell anemia (HCC)   Sickle cell pain crisis (HCC)   Leukocytosis  Sickle cell disease with pain crisis:  Decrease IV fluids to KVO Continue IV dilaudid PCA at full dose Toradol 15 mg every 6 hours Monitor vital signs closely, reevaluate pain scale regularly, and supplemental oxygen as needed.  Anemia of chronic disease:  Hemoglobin stable and consistent with baseline. There is no clinical indication for blood transfusion at this time.   Leukocytosis:  WBCs stable and consistent with baseline. No signs of acute infection. Follow labs in am.     Code Status: Full Code Family Communication: N/A Disposition Plan: Not yet ready for discharge  [place], MSN, FNP-C Patient [place], [place] [address] [phone]  If 7PM-7AM, please contact night-coverage.  08/04/2022, 1:42 PM  LOS: 0 days

## 2022-08-04 NOTE — Plan of Care (Signed)

## 2022-08-05 LAB — CBC
HCT: 20.4 % — ABNORMAL LOW (ref 39.0–52.0)
Hemoglobin: 7.2 g/dL — ABNORMAL LOW (ref 13.0–17.0)
MCH: 26.9 pg (ref 26.0–34.0)
MCHC: 35.3 g/dL (ref 30.0–36.0)
MCV: 76.1 fL — ABNORMAL LOW (ref 80.0–100.0)
Platelets: 365 10*3/uL (ref 150–400)
RBC: 2.68 MIL/uL — ABNORMAL LOW (ref 4.22–5.81)
RDW: 24.9 % — ABNORMAL HIGH (ref 11.5–15.5)
WBC: 17.6 10*3/uL — ABNORMAL HIGH (ref 4.0–10.5)
nRBC: 0.5 % — ABNORMAL HIGH (ref 0.0–0.2)

## 2022-08-05 LAB — BASIC METABOLIC PANEL
Anion gap: 8 (ref 5–15)
BUN: 6 mg/dL (ref 6–20)
CO2: 25 mmol/L (ref 22–32)
Calcium: 8.4 mg/dL — ABNORMAL LOW (ref 8.9–10.3)
Chloride: 100 mmol/L (ref 98–111)
Creatinine, Ser: 0.5 mg/dL — ABNORMAL LOW (ref 0.61–1.24)
GFR, Estimated: >60 mL/min (ref >60)
Glucose, Bld: 92 mg/dL (ref 70–99)
Potassium: 4.1 mmol/L (ref 3.5–5.1)
Sodium: 133 mmol/L — ABNORMAL LOW (ref 135–145)

## 2022-08-05 MED ORDER — HYDROMORPHONE 1 MG/ML IV SOLN
INTRAVENOUS | Status: DC
  Administered 2022-08-05: 1.5 mg via INTRAVENOUS
  Administered 2022-08-05 (×2): 4 mg via INTRAVENOUS
  Administered 2022-08-05: 1.5 mg via INTRAVENOUS
  Administered 2022-08-06: 1 mg via INTRAVENOUS
  Administered 2022-08-06: 2 mg via INTRAVENOUS
  Administered 2022-08-06: 3.5 mg via INTRAVENOUS
  Administered 2022-08-07: 1 mg via INTRAVENOUS
  Administered 2022-08-07: 1.5 mg via INTRAVENOUS
  Administered 2022-08-07: 0.5 mg via INTRAVENOUS
  Filled 2022-08-05 (×2): qty 30

## 2022-08-05 NOTE — Progress Notes (Signed)
I attest to student documentation.  Onita Pfluger, MSN-RN, CFPN Nursing Adjunct Faculty [employer] 

## 2022-08-05 NOTE — Progress Notes (Signed)
SICKLE CELL SERVICE PROGRESS NOTE  William Gallegos YSA:[id number] DOB: 12-20-2000 DOA: 08/03/2022 PCP: Dorena Dew, FNP  Assessment/Plan: Principal Problem:   Sickle cell crisis ([place]) Active Problems:   Sickle cell anemia (HCC)   Sickle cell pain crisis ([place])   Leukocytosis  sickle cell crisis: Patient is having pain crisis.  Currently on Dilaudid PCA but getting only 1 mg legs over an hour.  I will change it to 2 mg max.  Continue Toradol and supportive care. Anemia of chronic disease: H&H appears to be at baseline Leukocytosis: most likely due to vaso-occlusive crisis Dehydration: Continue hydration with D5 half-normal   Code Status: Full code Family Communication: No family at bedside Disposition Plan: [place]  Pager [phone]. If 7PM-7AM, please contact night-coverage.  08/05/2022, 12:09 PM  LOS: 1 day   Brief narrative: William Gallegos is a 21 y.o. male with medical history significant of Sickle cell disease type SS, acute chest syndrome who presents with concerns of leg pain and chest pain.   Patient reports that he has been lost to follow-up with hematology for at years.  Has been out of his pain medication.  He reports leg pain and chest pain.  Denies any shortness of breath, cough or fever.  No nausea, vomiting abdominal pain or diarrhea. Has not been hospitalized for sickle cell pain crisis in several years.  Consultants: None  Procedures: Chest x-ray  Antibiotics: None  HPI/Subjective: Patient still having pain at 8 out of 10 in the back and legs  Objective: Vitals:   08/05/22 0744 08/05/22 0800 08/05/22 0913 08/05/22 1103  BP: 117/71 116/73    Pulse: 63 73    Resp: 15 16 12 15   Temp: 98.1 F (36.7 C) 98.1 F (36.7 C)    TempSrc: Oral Oral    SpO2: 100%  100% 100%  Weight:      Height:       Weight change:   Intake/Output Summary (Last 24 hours) at 08/05/2022 1209 Last data filed at 08/05/2022 1000 Gross per 24 hour  Intake  2108.63 ml  Output 1200 ml  Net 908.63 ml    General: Alert, awake, oriented x3, in no acute distress.  HEENT: [place]/AT PEERL, EOMI Neck: Trachea midline,  no masses, no thyromegal,y no JVD, no carotid bruit OROPHARYNX:  Moist, No exudate/ erythema/lesions.  Heart: Regular rate and rhythm, without murmurs, rubs, gallops, PMI non-displaced, no heaves or thrills on palpation.  Lungs: Clear to auscultation, no wheezing or rhonchi noted. No increased vocal fremitus resonant to percussion  Abdomen: Soft, nontender, nondistended, positive bowel sounds, no masses no hepatosplenomegaly noted..  Neuro: No focal neurological deficits noted cranial nerves II through XII grossly intact. DTRs 2+ bilaterally upper and lower extremities. Strength 5 out of 5 in bilateral upper and lower extremities. Musculoskeletal: No warm swelling or erythema around joints, no spinal tenderness noted. Psychiatric: Patient alert and oriented x3, good insight and cognition, good recent to remote recall. Lymph node survey: No cervical axillary or inguinal lymphadenopathy noted.   Data Reviewed: Basic Metabolic Panel: Recent Labs  Lab 08/03/22 1751 08/05/22 0741  NA 138 133*  K 4.4 4.1  CL 106 100  CO2 23 25  GLUCOSE 100* 92  BUN 7 6  CREATININE 0.69 0.50*  CALCIUM 8.9 8.4*   Liver Function Tests: Recent Labs  Lab 08/03/22 1751  AST 50*  ALT 18  ALKPHOS 45  BILITOT 3.8*  PROT 8.8*  ALBUMIN 4.5   No results for input(s): "  LIPASE", "AMYLASE" in the last 168 hours. No results for input(s): "AMMONIA" in the last 168 hours. CBC: Recent Labs  Lab 08/03/22 1751 08/04/22 0524 08/05/22 0741  WBC 23.0* 23.4* 17.6*  NEUTROABS 17.8*  --   --   HGB 7.9* 7.4* 7.2*  HCT 21.9* 20.3* 20.4*  MCV 74.7* 75.5* 76.1*  PLT 429* 386 365   Cardiac Enzymes: No results for input(s): "CKTOTAL", "CKMB", "CKMBINDEX", "TROPONINI" in the last 168 hours. BNP (last 3 results) No results for input(s): "BNP" in the last 8760  hours.  ProBNP (last 3 results) No results for input(s): "PROBNP" in the last 8760 hours.  CBG: No results for input(s): "GLUCAP" in the last 168 hours.  No results found for this or any previous visit (from the past 240 hour(s)).   Studies: DG Chest 2 View  Result Date: 08/03/2022 CLINICAL DATA:  Chest pain. Sickle cell crisis starting a few hours ago. EXAM: CHEST - 2 VIEW COMPARISON:  AP chest 08/17/2019, chest two views 11/18/2018 FINDINGS: Cardiac silhouette is again mildly enlarged. Mediastinal contours are within normal limits. The lungs are clear. No pleural effusion or pneumothorax. There is an H shape of a lower thoracic vertebral body, similar to prior, consistent with sequela of sickle cell. Upper abdominal surgical clips are seen on lateral view, unchanged from prior. IMPRESSION: 1. No acute cardiopulmonary process. 2. Chronic lower thoracic vertebral body changes of sickle cell. Electronically Signed   By: Yvonne Kendall M.D.   On: 08/03/2022 15:56    Scheduled Meds:  enoxaparin (LOVENOX) injection  40 mg Subcutaneous Q24H   HYDROmorphone   Intravenous Q4H   ketorolac  15 mg Intravenous Q6H   senna-docusate  1 tablet Oral BID   Continuous Infusions:  sodium chloride 100 mL/hr at 08/05/22 1106    Principal Problem:   Sickle cell crisis (HCC) Active Problems:   Sickle cell anemia (HCC)   Sickle cell pain crisis (HCC)   Leukocytosis

## 2022-08-05 NOTE — Plan of Care (Signed)

## 2022-08-06 NOTE — Plan of Care (Signed)

## 2022-08-06 NOTE — Progress Notes (Signed)
SICKLE CELL SERVICE PROGRESS NOTE  William Gallegos [id number]:[id number] DOB: 06-27-2001 DOA: 08/03/2022 PCP: Dorena Dew, FNP  Assessment/Plan: Principal Problem:   Sickle cell crisis ([place]) Active Problems:   Sickle cell anemia (HCC)   Sickle cell pain crisis ([place])   Leukocytosis  sickle cell crisis: Patient is having pain crisis.  Pain level is down to 5 out of 10.  His goal is to get to under 5 prior to discharge which could be as early as tomorrow.  She was having some nausea today.  Continue Toradol and supportive care. Anemia of chronic disease: H&H appears to be at baseline Leukocytosis: most likely due to vaso-occlusive crisis Dehydration: Continue hydration with D5 half-normal   Code Status: Full code Family Communication: No family at bedside Disposition Plan: [place]  Pager [phone]. If 7PM-7AM, please contact night-coverage.  08/06/2022, 10:05 PM  LOS: 2 days   Brief narrative: William Gallegos is a 21 y.o. male with medical history significant of Sickle cell disease type SS, acute chest syndrome who presents with concerns of leg pain and chest pain.   Patient reports that he has been lost to follow-up with hematology for at years.  Has been out of his pain medication.  He reports leg pain and chest pain.  Denies any shortness of breath, cough or fever.  No nausea, vomiting abdominal pain or diarrhea. Has not been hospitalized for sickle cell pain crisis in several years.  Consultants: None  Procedures: Chest x-ray  Antibiotics: None  HPI/Subjective: Patient has improved with pain down to 5 out of 10.  He was having nausea today which has been treated with Zofran so far.  Objective: Vitals:   08/06/22 1255 08/06/22 1531 08/06/22 2010 08/06/22 2026  BP: 127/81 117/69 130/72   Pulse: 81 78 89   Resp: 17 18 16 14   Temp: 98.7 F (37.1 C) 98.4 F (36.9 C) 98.4 F (36.9 C)   TempSrc: Oral Oral Oral   SpO2: 97% 97% 90% 90%  Weight:      Height:        Weight change:   Intake/Output Summary (Last 24 hours) at 08/06/2022 2205 Last data filed at 08/06/2022 1852 Gross per 24 hour  Intake 2595.85 ml  Output 1000 ml  Net 1595.85 ml     General: Alert, awake, oriented x3, in no acute distress.  HEENT: [place]/AT PEERL, EOMI Neck: Trachea midline,  no masses, no thyromegal,y no JVD, no carotid bruit OROPHARYNX:  Moist, No exudate/ erythema/lesions.  Heart: Regular rate and rhythm, without murmurs, rubs, gallops, PMI non-displaced, no heaves or thrills on palpation.  Lungs: Clear to auscultation, no wheezing or rhonchi noted. No increased vocal fremitus resonant to percussion  Abdomen: Soft, nontender, nondistended, positive bowel sounds, no masses no hepatosplenomegaly noted..  Neuro: No focal neurological deficits noted cranial nerves II through XII grossly intact. DTRs 2+ bilaterally upper and lower extremities. Strength 5 out of 5 in bilateral upper and lower extremities. Musculoskeletal: No warm swelling or erythema around joints, no spinal tenderness noted. Psychiatric: Patient alert and oriented x3, good insight and cognition, good recent to remote recall. Lymph node survey: No cervical axillary or inguinal lymphadenopathy noted.   Data Reviewed: Basic Metabolic Panel: Recent Labs  Lab 08/03/22 1751 08/05/22 0741  NA 138 133*  K 4.4 4.1  CL 106 100  CO2 23 25  GLUCOSE 100* 92  BUN 7 6  CREATININE 0.69 0.50*  CALCIUM 8.9 8.4*    Liver Function Tests: Recent  Labs  Lab 08/03/22 1751  AST 50*  ALT 18  ALKPHOS 45  BILITOT 3.8*  PROT 8.8*  ALBUMIN 4.5    No results for input(s): "LIPASE", "AMYLASE" in the last 168 hours. No results for input(s): "AMMONIA" in the last 168 hours. CBC: Recent Labs  Lab 08/03/22 1751 08/04/22 0524 08/05/22 0741  WBC 23.0* 23.4* 17.6*  NEUTROABS 17.8*  --   --   HGB 7.9* 7.4* 7.2*  HCT 21.9* 20.3* 20.4*  MCV 74.7* 75.5* 76.1*  PLT 429* 386 365    Cardiac Enzymes: No results  for input(s): "CKTOTAL", "CKMB", "CKMBINDEX", "TROPONINI" in the last 168 hours. BNP (last 3 results) No results for input(s): "BNP" in the last 8760 hours.  ProBNP (last 3 results) No results for input(s): "PROBNP" in the last 8760 hours.  CBG: No results for input(s): "GLUCAP" in the last 168 hours.  No results found for this or any previous visit (from the past 240 hour(s)).   Studies: DG Chest 2 View  Result Date: 08/03/2022 CLINICAL DATA:  Chest pain. Sickle cell crisis starting a few hours ago. EXAM: CHEST - 2 VIEW COMPARISON:  AP chest 08/17/2019, chest two views 11/18/2018 FINDINGS: Cardiac silhouette is again mildly enlarged. Mediastinal contours are within normal limits. The lungs are clear. No pleural effusion or pneumothorax. There is an H shape of a lower thoracic vertebral body, similar to prior, consistent with sequela of sickle cell. Upper abdominal surgical clips are seen on lateral view, unchanged from prior. IMPRESSION: 1. No acute cardiopulmonary process. 2. Chronic lower thoracic vertebral body changes of sickle cell. Electronically Signed   By: Neita Garnet M.D.   On: 08/03/2022 15:56    Scheduled Meds:  enoxaparin (LOVENOX) injection  40 mg Subcutaneous Q24H   HYDROmorphone   Intravenous Q4H   ketorolac  15 mg Intravenous Q6H   senna-docusate  1 tablet Oral BID   Continuous Infusions:  sodium chloride 100 mL/hr at 08/06/22 6578    Principal Problem:   Sickle cell crisis [facility]) Active Problems:   Sickle cell anemia (HCC)   Sickle cell pain crisis (HCC)   Leukocytosis

## 2022-08-07 LAB — BASIC METABOLIC PANEL
Anion gap: 11 (ref 5–15)
BUN: 7 mg/dL (ref 6–20)
CO2: 20 mmol/L — ABNORMAL LOW (ref 22–32)
Calcium: 7.8 mg/dL — ABNORMAL LOW (ref 8.9–10.3)
Chloride: 102 mmol/L (ref 98–111)
Creatinine, Ser: 0.54 mg/dL — ABNORMAL LOW (ref 0.61–1.24)
GFR, Estimated: >60 mL/min (ref >60)
Glucose, Bld: 94 mg/dL (ref 70–99)
Potassium: 4.1 mmol/L (ref 3.5–5.1)
Sodium: 133 mmol/L — ABNORMAL LOW (ref 135–145)

## 2022-08-07 LAB — CBC
HCT: 17.7 % — ABNORMAL LOW (ref 39.0–52.0)
Hemoglobin: 6.4 g/dL — CL (ref 13.0–17.0)
MCH: 27.2 pg (ref 26.0–34.0)
MCHC: 36.2 g/dL — ABNORMAL HIGH (ref 30.0–36.0)
MCV: 75.3 fL — ABNORMAL LOW (ref 80.0–100.0)
Platelets: 363 10*3/uL (ref 150–400)
RBC: 2.35 MIL/uL — ABNORMAL LOW (ref 4.22–5.81)
RDW: 24.8 % — ABNORMAL HIGH (ref 11.5–15.5)
WBC: 20.4 10*3/uL — ABNORMAL HIGH (ref 4.0–10.5)
nRBC: 0.7 % — ABNORMAL HIGH (ref 0.0–0.2)

## 2022-08-07 LAB — PREPARE RBC (CROSSMATCH)

## 2022-08-07 MED ORDER — ACETAMINOPHEN 325 MG PO TABS
650.0000 mg | ORAL_TABLET | Freq: Once | ORAL | Status: AC
  Administered 2022-08-07: 650 mg via ORAL
  Filled 2022-08-07: qty 2

## 2022-08-07 MED ORDER — SODIUM CHLORIDE 0.9% IV SOLUTION: Freq: Once | INTRAVENOUS | Status: AC

## 2022-08-07 MED ORDER — FOLIC ACID 1 MG PO TABS: 1.0000 mg | ORAL_TABLET | Freq: Every day | ORAL | 11 refills | Status: DC

## 2022-08-07 MED ORDER — IBUPROFEN 800 MG PO TABS: 800.0000 mg | ORAL_TABLET | Freq: Three times a day (TID) | ORAL | 0 refills | Status: DC | PRN

## 2022-08-07 MED ORDER — DIPHENHYDRAMINE HCL 50 MG/ML IJ SOLN
25.0000 mg | Freq: Once | INTRAMUSCULAR | Status: AC
  Administered 2022-08-07: 25 mg via INTRAVENOUS
  Filled 2022-08-07: qty 1

## 2022-08-07 MED ORDER — OXYCODONE-ACETAMINOPHEN 5-325 MG PO TABS: 1.0000 | ORAL_TABLET | Freq: Four times a day (QID) | ORAL | 0 refills | Status: DC | PRN

## 2022-08-07 NOTE — Discharge Summary (Signed)
Physician Discharge Summary  Momen Ham [id number]:[id number] DOB: 2001/06/04 DOA: 08/03/2022  PCP: Dorena Dew, FNP  Admit date: 08/03/2022  Discharge date: 08/07/2022  Discharge Diagnoses:  Principal Problem:   Sickle cell crisis ([place]) Active Problems:   Sickle cell anemia (HCC)   Sickle cell pain crisis ([place])   Leukocytosis   Discharge Condition: Stable  Disposition:   Follow-up Information     Dorena Dew, FNP Follow up in 1 week(s).   Specialty: Family Medicine Contact information: [address] [place] [address] [phone]                Pt is discharged home in good condition and is to follow up with Dorena Dew, FNP this week to have labs evaluated. Alyssa Mancera is instructed to increase activity slowly and balance with rest for the next few days, and use prescribed medication to complete treatment of pain  Diet: Regular Wt Readings from Last 3 Encounters:  08/03/22 77.1 kg  08/18/19 74.6 kg  08/05/19 73 kg    History of present illness:  Deunta Beneke is a 21 year old male with a medical history significant for sickle cell disease type SS, acute chest syndrome presented with concerns of leg pain and chest pain. Patient reports he.  He has been out of pain medication.  He reports leg pain and chest pain.  Denies any shortness of breath.  No nausea, vomiting, abdominal pain, or diarrhea.  Has not been hospitalized for sickle cell pain crisis in several years.  In the ED, he was afebrile and normotensive.  Hemoglobin stable at 7.9 g/dL, with his baseline around 7-8.  Platelets 429,000.  WBCs 23.  Creatinine stable at 0.69.  Chest ray negative for any pneumonia.  He was given a total of 1 mg of Dilaudid in the ED but hospitalist consulted for uncontrolled sickle cell pain.  Hospital Course:  Sickle cell disease with pain crisis: Patient was admitted for sickle cell pain crisis and managed appropriately with IVF, IV Dilaudid  via PCA and IV Toradol, as well as other adjunct therapies per sickle cell pain management protocols.  Patient transition to oral medications.  He will discharge home with Percocet 5-325 mg every 6 hours as needed for severe breakthrough pain #20.  PDMP reviewed prior to prescribing opiate medications, no inconsistencies noted.  Patient will follow-up at [facility] health patient care center in 1 week.  Anemia of chronic disease: Prior to discharge, patient's hemoglobin below baseline is 6.4 g/dL.  He was transfused 1 unit PRBCs without complication.  Patient will [facility] at [facility] to repeat CBC with differential in 2 days. Patient is alert, oriented, and ambulating without assistance.  He is aware of upcoming appointments.  Patient was therefore discharged home today in a hemodynamically stable condition.   Ermine will follow-up with PCP within 1 week of this discharge. Abbas was counseled extensively about nonpharmacologic means of pain management, patient verbalized understanding and was appreciative of  the care received during this admission.   We discussed the need for good hydration, monitoring of hydration status, avoidance of heat, cold, stress, and infection triggers.  Patient was reminded of the need to seek medical attention immediately if any symptom of bleeding, anemia, or infection occurs.  Discharge Exam: Vitals:   08/07/22 1152 08/07/22 1202  BP: 129/81   Pulse: (!) 106   Resp: 18 20  Temp: 98.7 F (37.1 C)   SpO2: 95% 94%   Vitals:   08/07/22  1000 08/07/22 1100 08/07/22 1152 08/07/22 1202  BP:   129/81   Pulse:   (!) 106   Resp: 16 18 18 20   Temp:   98.7 F (37.1 C)   TempSrc:   Oral   SpO2:   95% 94%  Weight:      Height:        General appearance : Awake, alert, not in any distress. Speech Clear. Not toxic looking HEENT: Atraumatic and Normocephalic, pupils equally reactive to light and accomodation Neck: Supple, no JVD. No cervical lymphadenopathy.  Chest:  Good air entry bilaterally, no added sounds  CVS: S1 S2 regular, no murmurs.  Abdomen: Bowel sounds present, Non tender and not distended with no gaurding, rigidity or rebound. Extremities: B/L Lower Ext shows no edema, both legs are warm to touch Neurology: Awake alert, and oriented X 3, CN II-XII intact, Non focal Skin: No Rash  Discharge Instructions   Allergies as of 08/07/2022       Reactions   Carrot [daucus [place]] Anaphylaxis, Swelling   Swelling of face    Peanuts [peanut Oil] Anaphylaxis, Swelling   Swelling of face    Erythromycin Hives   Other Swelling   Raisins--- facial swelling Mushrooms---face swelling        Medication List     STOP taking these medications    hydroxyurea 500 MG capsule Commonly known as: HYDREA   lisinopril 2.5 MG tablet Commonly known as: ZESTRIL       TAKE these medications    folic acid 1 MG tablet Commonly known as: FOLVITE Take 1 tablet (1 mg total) by mouth daily. What changed: Another medication with the same name was added. Make sure you understand how and when to take each.   folic acid 1 MG tablet Commonly known as: FOLVITE Take 1 tablet (1 mg total) by mouth daily. What changed: You were already taking a medication with the same name, and this prescription was added. Make sure you understand how and when to take each.   ibuprofen 800 MG tablet Commonly known as: ADVIL Take 1 tablet (800 mg total) by mouth every 8 (eight) hours as needed.   oxyCODONE-acetaminophen 5-325 MG tablet Commonly known as: PERCOCET/ROXICET Take 1 tablet by mouth every 6 (six) hours as needed for moderate pain.        The results of significant diagnostics from this hospitalization (including imaging, microbiology, ancillary and laboratory) are listed below for reference.    Significant Diagnostic Studies: DG Chest 2 View  Result Date: 08/03/2022 CLINICAL DATA:  Chest pain. Sickle cell crisis starting a few hours ago. EXAM: CHEST -  2 VIEW COMPARISON:  AP chest 08/17/2019, chest two views 11/18/2018 FINDINGS: Cardiac silhouette is again mildly enlarged. Mediastinal contours are within normal limits. The lungs are clear. No pleural effusion or pneumothorax. There is an H shape of a lower thoracic vertebral body, similar to prior, consistent with sequela of sickle cell. Upper abdominal surgical clips are seen on lateral view, unchanged from prior. IMPRESSION: 1. No acute cardiopulmonary process. 2. Chronic lower thoracic vertebral body changes of sickle cell. Electronically Signed   By: Yvonne Kendall M.D.   On: 08/03/2022 15:56    Microbiology: No results found for this or any previous visit (from the past 240 hour(s)).   Labs: Basic Metabolic Panel: Recent Labs  Lab 08/03/22 1751 08/05/22 0741  NA 138 133*  K 4.4 4.1  CL 106 100  CO2 23 25  GLUCOSE 100* 92  BUN 7 6  CREATININE 0.69 0.50*  CALCIUM 8.9 8.4*   Liver Function Tests: Recent Labs  Lab 08/03/22 1751  AST 50*  ALT 18  ALKPHOS 45  BILITOT 3.8*  PROT 8.8*  ALBUMIN 4.5   No results for input(s): "LIPASE", "AMYLASE" in the last 168 hours. No results for input(s): "AMMONIA" in the last 168 hours. CBC: Recent Labs  Lab 08/03/22 1751 08/04/22 0524 08/05/22 0741  WBC 23.0* 23.4* 17.6*  NEUTROABS 17.8*  --   --   HGB 7.9* 7.4* 7.2*  HCT 21.9* 20.3* 20.4*  MCV 74.7* 75.5* 76.1*  PLT 429* 386 365   Cardiac Enzymes: No results for input(s): "CKTOTAL", "CKMB", "CKMBINDEX", "TROPONINI" in the last 168 hours. BNP: Invalid input(s): "POCBNP" CBG: No results for input(s): "GLUCAP" in the last 168 hours.  Time coordinating discharge: 30 minutes  Signed:  Donia Pounds  APRN, MSN, FNP-C Patient [place] [address] [place],  [place] [address] [phone]  [facility] 08/07/2022, 2:17 PM

## 2022-08-07 NOTE — Progress Notes (Deleted)
Patient was advised by the Provider that he needed to stay another night and receive IV fluids. Patient was asked if he were agreeable to receiving another IV since he demanded that the other one be removed, he stated " if fluids are all he has to offer than he can kiss my ass, I'm not getting stuck again". Patient is reporting scrotum swelling when receiving fluids.

## 2022-08-07 NOTE — Progress Notes (Signed)
PCA stopped, 21ml wasted. Blood started and is being tolerated thus far

## 2022-08-07 NOTE — Progress Notes (Signed)
  Transition of Care [facility]) Screening Note   Patient Details  Name: William Gallegos Date of Birth: October 20, 2000   Transition of Care [facility]) CM/SW Contact:    Vassie Moselle, [place] Phone Number: 08/07/2022, 10:59 AM    Transition of Care Department [facility]) has reviewed patient and no TOC needs have been identified at this time. We will continue to monitor patient advancement through interdisciplinary progression rounds. If new patient transition needs arise, please place a TOC consult.

## 2022-08-08 ENCOUNTER — Telehealth: Payer: Self-pay

## 2022-08-08 LAB — TYPE AND SCREEN
ABO/RH(D): A POS
Antibody Screen: NEGATIVE
Unit division: 0

## 2022-08-08 LAB — BPAM RBC
Blood Product Expiration Date: 202312082359
ISSUE DATE / TIME: 202311061603
Unit Type and Rh: 5100

## 2022-08-08 NOTE — Telephone Encounter (Signed)
Transition Care Management Unsuccessful Follow-up Telephone Call  Date of discharge and from where:  08/07/22  Attempts:  1st Attempt  Reason for unsuccessful TCM follow-up call:  Left voice message

## 2022-08-15 ENCOUNTER — Encounter (HOSPITAL_COMMUNITY): Payer: Self-pay

## 2022-08-28 ENCOUNTER — Emergency Department (HOSPITAL_COMMUNITY)
Admission: EM | Admit: 2022-08-28 | Discharge: 2022-08-29 | Disposition: A | Discharge status: Other Acute Inpatient Hospital | Payer: Federal, State, Local not specified - Other | Attending: Emergency Medicine | Admitting: Emergency Medicine

## 2022-08-28 DIAGNOSIS — M79601 Pain in right arm: Secondary | ICD-10-CM | POA: Insufficient documentation

## 2022-08-28 DIAGNOSIS — F332 Major depressive disorder, recurrent severe without psychotic features: Secondary | ICD-10-CM | POA: Insufficient documentation

## 2022-08-28 DIAGNOSIS — R45851 Suicidal ideations: Secondary | ICD-10-CM | POA: Insufficient documentation

## 2022-08-28 DIAGNOSIS — Z20822 Contact with and (suspected) exposure to covid-19: Secondary | ICD-10-CM | POA: Insufficient documentation

## 2022-08-28 DIAGNOSIS — F1420 Cocaine dependence, uncomplicated: Secondary | ICD-10-CM | POA: Insufficient documentation

## 2022-08-28 DIAGNOSIS — Z5900 Homelessness unspecified: Secondary | ICD-10-CM | POA: Insufficient documentation

## 2022-08-28 DIAGNOSIS — F322 Major depressive disorder, single episode, severe without psychotic features: Secondary | ICD-10-CM | POA: Diagnosis present
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
## 2022-08-29 ENCOUNTER — Inpatient Hospital Stay (HOSPITAL_COMMUNITY)
Admission: AD | Admit: 2022-08-29 | Discharge: 2022-09-07 | DRG: 885 | Disposition: A | Discharge status: Home / Self Care | Payer: Federal, State, Local not specified - Other | Source: Intra-hospital | Attending: Psychiatry | Admitting: Psychiatry

## 2022-08-29 ENCOUNTER — Encounter (HOSPITAL_COMMUNITY): Payer: Self-pay

## 2022-08-29 DIAGNOSIS — R45851 Suicidal ideations: Secondary | ICD-10-CM | POA: Diagnosis present

## 2022-08-29 DIAGNOSIS — D5700 Hb-SS disease with crisis, unspecified: Secondary | ICD-10-CM | POA: Diagnosis present

## 2022-08-29 DIAGNOSIS — F1210 Cannabis abuse, uncomplicated: Secondary | ICD-10-CM | POA: Diagnosis present

## 2022-08-29 DIAGNOSIS — F322 Major depressive disorder, single episode, severe without psychotic features: Secondary | ICD-10-CM | POA: Diagnosis not present

## 2022-08-29 DIAGNOSIS — N4830 Priapism, unspecified: Secondary | ICD-10-CM | POA: Diagnosis present

## 2022-08-29 DIAGNOSIS — F411 Generalized anxiety disorder: Secondary | ICD-10-CM | POA: Diagnosis present

## 2022-08-29 DIAGNOSIS — Z832 Family history of diseases of the blood and blood-forming organs and certain disorders involving the immune mechanism: Secondary | ICD-10-CM

## 2022-08-29 DIAGNOSIS — Z1152 Encounter for screening for COVID-19: Secondary | ICD-10-CM | POA: Diagnosis not present

## 2022-08-29 DIAGNOSIS — F1220 Cannabis dependence, uncomplicated: Secondary | ICD-10-CM | POA: Diagnosis not present

## 2022-08-29 DIAGNOSIS — Z5902 Unsheltered homelessness: Secondary | ICD-10-CM | POA: Diagnosis not present

## 2022-08-29 DIAGNOSIS — D571 Sickle-cell disease without crisis: Secondary | ICD-10-CM | POA: Diagnosis present

## 2022-08-29 DIAGNOSIS — Z91010 Allergy to peanuts: Secondary | ICD-10-CM | POA: Diagnosis not present

## 2022-08-29 DIAGNOSIS — Z608 Other problems related to social environment: Secondary | ICD-10-CM | POA: Diagnosis present

## 2022-08-29 DIAGNOSIS — Z560 Unemployment, unspecified: Secondary | ICD-10-CM

## 2022-08-29 DIAGNOSIS — Z818 Family history of other mental and behavioral disorders: Secondary | ICD-10-CM

## 2022-08-29 DIAGNOSIS — F332 Major depressive disorder, recurrent severe without psychotic features: Principal | ICD-10-CM | POA: Diagnosis present

## 2022-08-29 DIAGNOSIS — Z91018 Allergy to other foods: Secondary | ICD-10-CM

## 2022-08-29 DIAGNOSIS — D75839 Thrombocytosis, unspecified: Secondary | ICD-10-CM | POA: Diagnosis present

## 2022-08-29 DIAGNOSIS — Z881 Allergy status to other antibiotic agents status: Secondary | ICD-10-CM | POA: Diagnosis not present

## 2022-08-29 LAB — RESP PANEL BY RT-PCR (FLU A&B, COVID) ARPGX2
Influenza A by PCR: NEGATIVE
Influenza B by PCR: NEGATIVE
SARS Coronavirus 2 by RT PCR: NEGATIVE

## 2022-08-29 LAB — COMPREHENSIVE METABOLIC PANEL
ALT: 15 U/L (ref 0–44)
AST: 37 U/L (ref 15–41)
Albumin: 3.9 g/dL (ref 3.5–5.0)
Alkaline Phosphatase: 53 U/L (ref 38–126)
Anion gap: 8 (ref 5–15)
BUN: 7 mg/dL (ref 6–20)
CO2: 21 mmol/L — ABNORMAL LOW (ref 22–32)
Calcium: 8.4 mg/dL — ABNORMAL LOW (ref 8.9–10.3)
Chloride: 104 mmol/L (ref 98–111)
Creatinine, Ser: 0.6 mg/dL — ABNORMAL LOW (ref 0.61–1.24)
GFR, Estimated: >60 mL/min (ref >60)
Glucose, Bld: 107 mg/dL — ABNORMAL HIGH (ref 70–99)
Potassium: 3.5 mmol/L (ref 3.5–5.1)
Sodium: 133 mmol/L — ABNORMAL LOW (ref 135–145)
Total Bilirubin: 5.1 mg/dL — ABNORMAL HIGH (ref 0.3–1.2)
Total Protein: 8.2 g/dL — ABNORMAL HIGH (ref 6.5–8.1)

## 2022-08-29 LAB — CBC
HCT: 23.6 % — ABNORMAL LOW (ref 39.0–52.0)
Hemoglobin: 8.3 g/dL — ABNORMAL LOW (ref 13.0–17.0)
MCH: 28.1 pg (ref 26.0–34.0)
MCHC: 35.2 g/dL (ref 30.0–36.0)
MCV: 80 fL (ref 80.0–100.0)
Platelets: 611 10*3/uL — ABNORMAL HIGH (ref 150–400)
RBC: 2.95 MIL/uL — ABNORMAL LOW (ref 4.22–5.81)
RDW: 24.4 % — ABNORMAL HIGH (ref 11.5–15.5)
WBC: 19 10*3/uL — ABNORMAL HIGH (ref 4.0–10.5)
nRBC: 0.8 % — ABNORMAL HIGH (ref 0.0–0.2)

## 2022-08-29 LAB — RAPID URINE DRUG SCREEN, HOSP PERFORMED
Amphetamines: NOT DETECTED
Barbiturates: NOT DETECTED
Benzodiazepines: NOT DETECTED
Cocaine: NOT DETECTED
Opiates: NOT DETECTED
Tetrahydrocannabinol: POSITIVE — AB

## 2022-08-29 LAB — SALICYLATE LEVEL: Salicylate Lvl: <7 mg/dL — ABNORMAL LOW (ref 7.0–30.0)

## 2022-08-29 LAB — ETHANOL: Alcohol, Ethyl (B): <10 mg/dL (ref <10)

## 2022-08-29 LAB — ACETAMINOPHEN LEVEL: Acetaminophen (Tylenol), Serum: <10 ug/mL — ABNORMAL LOW (ref 10–30)

## 2022-08-29 MED ORDER — ALUM & MAG HYDROXIDE-SIMETH 200-200-20 MG/5ML PO SUSP: 30.0000 mL | ORAL | Status: DC | PRN

## 2022-08-29 MED ORDER — MAGNESIUM HYDROXIDE 400 MG/5ML PO SUSP: 30.0000 mL | Freq: Every day | ORAL | Status: DC | PRN

## 2022-08-29 MED ORDER — OXYCODONE-ACETAMINOPHEN 5-325 MG PO TABS
1.0000 | ORAL_TABLET | Freq: Four times a day (QID) | ORAL | Status: DC | PRN
  Administered 2022-08-29: 1 via ORAL
  Filled 2022-08-29: qty 1

## 2022-08-29 MED ORDER — OXYCODONE-ACETAMINOPHEN 5-325 MG PO TABS
1.0000 | ORAL_TABLET | Freq: Four times a day (QID) | ORAL | Status: DC | PRN
  Administered 2022-08-29 – 2022-09-06 (×14): 1 via ORAL
  Filled 2022-08-29 (×14): qty 1

## 2022-08-29 MED ORDER — ESCITALOPRAM OXALATE 5 MG PO TABS
5.0000 mg | ORAL_TABLET | Freq: Every day | ORAL | Status: DC
  Administered 2022-08-30 – 2022-08-31 (×2): 5 mg via ORAL
  Filled 2022-08-29 (×3): qty 1

## 2022-08-29 MED ORDER — ACETAMINOPHEN 325 MG PO TABS
650.0000 mg | ORAL_TABLET | Freq: Four times a day (QID) | ORAL | Status: DC | PRN
  Administered 2022-08-30 – 2022-09-03 (×7): 650 mg via ORAL
  Filled 2022-08-29 (×7): qty 2

## 2022-08-29 MED ORDER — ESCITALOPRAM OXALATE 10 MG PO TABS
5.0000 mg | ORAL_TABLET | Freq: Every day | ORAL | Status: DC
  Administered 2022-08-29: 5 mg via ORAL
  Filled 2022-08-29: qty 1

## 2022-08-29 MED ORDER — FOLIC ACID 1 MG PO TABS
1.0000 mg | ORAL_TABLET | Freq: Every day | ORAL | Status: DC
  Administered 2022-08-30 – 2022-09-07 (×9): 1 mg via ORAL
  Filled 2022-08-29: qty 1
  Filled 2022-08-29: qty 10
  Filled 2022-08-29 (×7): qty 1
  Filled 2022-08-29: qty 10
  Filled 2022-08-29 (×2): qty 1

## 2022-08-29 NOTE — Consult Note (Signed)
BH ED ASSESSMENT   Reason for Consult:  Psychiatry evaluation Referring Physician:  ER Physician Patient Identification: William Gallegos MRN:  [id number] ED Chief Complaint: Depression, major, single episode, severe (HCC)  Diagnosis:  Principal Problem:   Depression, major, single episode, severe (HCC) Active Problems:   Suicide ideation   ED Assessment Time Calculation: Start Time: 1320 Stop Time: 1345 Total Time in Minutes (Assessment Completion): 25   Subjective:   William Gallegos is a 21 y.o. male patient admitted with no known Psychiatry hx brought in last night by the Police for suicidal ideation.  He had a gun in his back pack and same was taken away by the Police.  Patient has a hx of Sickle Cell Anemia which is stable at this time.  Patient reports he has ben feeling suicidal off and on for five years but homelessness has increased his episodes of suicide ideation.  He denies previous try.  HPI:  Patient was seen in the stretcher calm and was fully engaged in the conversation.  Patient admitted that he had a gun in his back pack and he had planned to use it.  He also stated that the Police officer took the gun from him.  He is homeless and states that he and his mom does not get along well.  He has no place to stay and with the cold weather his sickle cell Anemia gets worse.  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  He denies SI/HI/AVH at this time. We will seek inpatient mental health treatment for patient.  We will start low dose Lexapro for depression and will offer sleep medications as needed.  Past Psychiatric History: none  Risk to Self or Others: Is the patient at risk to self? No Has the patient been a risk to self in the past 6 months? No Has the patient been a risk to self within the distant past? No Is the patient a risk to others? No Has the patient been a risk to others in the past 6 months? No Has the patient been a risk to others within the distant  past? No  [place] Scale:  Flowsheet Row ED from 08/28/2022 in [facility] to Hosp-Admission (Discharged) from 08/03/2022 in [facility]  C-SSRS RISK CATEGORY High Risk No Risk       AIMS:  , , ,  ,   ASAM:    Substance Abuse:  Alcohol / Drug Use Pain Medications: Oxycodone and ibuprophen for sicle cell pain. Prescriptions: No oher prescriptions Over the Counter: none History of alcohol / drug use?: Yes  Past Medical History:  Past Medical History:  Diagnosis Date   Acute chest syndrome (HCC)    Seasonal allergies    Sickle cell anemia (HCC)     Past Surgical History:  Procedure Laterality Date   adnoidectomy     CHOLECYSTECTOMY     TONSILLECTOMY AND ADENOIDECTOMY     Family History:  Family History  Problem Relation Age of Onset   Hypertension Other    Diabetes Other    Cancer Other    Stroke Other    Sickle cell anemia Brother    Sickle cell anemia Maternal Grandfather    Family Psychiatric  History: denies, never discussed in the family. Social History:  Social History   Substance and Sexual Activity  Alcohol Use No     Social History   Substance and Sexual Activity  Drug Use No    Social History   Socioeconomic History   Marital status: Single    Spouse name: Not on file   Number of children: Not on file   Years of education: Not on file   Highest education level: Not on file  Occupational History   Not on file  Tobacco Use   Smoking status: Never   Smokeless tobacco: Never  Vaping Use   Vaping Use: Not on file  Substance and Sexual Activity   Alcohol use: No   Drug use: No   Sexual activity: Yes  Other Topics Concern   Not on file  Social History Narrative   Not on file   Social Determinants of Health   Financial Resource Strain: Not on file  Food Insecurity: No Food Insecurity (08/04/2022)   Hunger Vital Sign    Worried About Running Out of Food in the  Last Year: Never true    Ran Out of Food in the Last Year: Never true  Transportation Needs: No Transportation Needs (08/04/2022)   PRAPARE - Administrator, Civil ServiceTransportation    Lack of Transportation (Medical): No    Lack of Transportation (Non-Medical): No  Physical Activity: Not on file  Stress: Not on file  Social Connections: Not on file   Additional Social History:    Allergies:   Allergies  Allergen Reactions   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 and Swelling    Swelling of face    Peanuts [Peanut Oil] Anaphylaxis and Swelling    Swelling of face    Erythromycin Hives   Other Swelling    Raisins--- facial swelling Mushrooms---face swelling    Labs:  Results for orders placed or performed during the hospital encounter of 08/28/22 (from the past 48 hour(s))  Comprehensive metabolic panel     Status: Abnormal   Collection Time: 08/29/22  1:30 AM  Result Value Ref Range   Sodium 133 (L) 135 - 145 mmol/L   Potassium 3.5 3.5 - 5.1 mmol/L   Chloride 104 98 - 111 mmol/L   CO2 21 (L) 22 - 32 mmol/L   Glucose, Bld 107 (H) 70 - 99 mg/dL    Comment: Glucose reference range applies only to samples taken after fasting for at least 8 hours.   BUN 7 6 - 20 mg/dL   Creatinine, Ser 1.610.60 (L) 0.61 - 1.24 mg/dL   Calcium 8.4 (L) 8.9 - 10.3 mg/dL   Total Protein 8.2 (H) 6.5 - 8.1 g/dL   Albumin 3.9 3.5 - 5.0 g/dL   AST 37 15 - 41 U/L   ALT 15 0 - 44 U/L   Alkaline Phosphatase 53 38 - 126 U/L   Total Bilirubin 5.1 (H) 0.3 - 1.2 mg/dL   GFR, Estimated >09>60 >60>60 mL/min    Comment: (NOTE) Calculated using the CKD-EPI Creatinine Equation (2021)    Anion gap 8 5 - 15    Comment: Performed at [facility], [address] [place], [place] [address]  Ethanol     Status: None   Collection Time: 08/29/22  1:30 AM  Result Value Ref Range   Alcohol, Ethyl (B) <10 <10 mg/dL    Comment: (NOTE) Lowest detectable limit for serum alcohol is 10 mg/dL.  For medical purposes only. Performed at  [facility], [address] [place], [place] [address]   Salicylate level     Status: Abnormal   Collection Time: 08/29/22  1:30 AM  Result Value Ref Range   Salicylate  Lvl <7.0 (L) 7.0 - 30.0 mg/dL    Comment: Performed at [facility], [address], [place], [place] [address]  Acetaminophen level     Status: Abnormal   Collection Time: 08/29/22  1:30 AM  Result Value Ref Range   Acetaminophen (Tylenol), Serum <10 (L) 10 - 30 ug/mL    Comment: (NOTE) Therapeutic concentrations vary significantly. A range of 10-30 ug/mL  may be an effective concentration for many patients. However, some  are best treated at concentrations outside of this range. Acetaminophen concentrations >150 ug/mL at 4 hours after ingestion  and >50 ug/mL at 12 hours after ingestion are often associated with  toxic reactions.  Performed at [facility], [address], [place], [place] [address]   cbc     Status: Abnormal   Collection Time: 08/29/22  1:30 AM  Result Value Ref Range   WBC 19.0 (H) 4.0 - 10.5 K/uL   RBC 2.95 (L) 4.22 - 5.81 MIL/uL   Hemoglobin 8.3 (L) 13.0 - 17.0 g/dL   HCT 73.2 (L) 20.2 - 54.2 %   MCV 80.0 80.0 - 100.0 fL   MCH 28.1 26.0 - 34.0 pg   MCHC 35.2 30.0 - 36.0 g/dL   RDW 70.6 (H) 23.7 - 62.8 %   Platelets 611 (H) 150 - 400 K/uL   nRBC 0.8 (H) 0.0 - 0.2 %    Comment: Performed at [facility], [address], [place], [place] [address]  Rapid urine drug screen (hospital performed)     Status: Abnormal   Collection Time: 08/29/22  1:31 AM  Result Value Ref Range   Opiates NONE DETECTED NONE DETECTED   Cocaine NONE DETECTED NONE DETECTED   Benzodiazepines NONE DETECTED NONE DETECTED   Amphetamines NONE DETECTED NONE DETECTED   Tetrahydrocannabinol POSITIVE (A) NONE DETECTED   Barbiturates NONE DETECTED NONE DETECTED    Comment: (NOTE) DRUG SCREEN FOR MEDICAL PURPOSES ONLY.  IF CONFIRMATION IS  NEEDED FOR ANY PURPOSE, NOTIFY LAB WITHIN 5 DAYS.  LOWEST DETECTABLE LIMITS FOR URINE DRUG SCREEN Drug Class                     Cutoff (ng/mL) Amphetamine and metabolites    1000 Barbiturate and metabolites    200 Benzodiazepine                 200 Opiates and metabolites        300 Cocaine and metabolites        300 THC                            50 Performed at [facility], [address], [place], [place] [address]     Current Facility-Administered Medications  Medication Dose Route Frequency Provider Last Rate Last Admin   escitalopram (LEXAPRO) tablet 5 mg  5 mg Oral Daily Jourdan Durbin C, NP       oxyCODONE-acetaminophen (PERCOCET/ROXICET) 5-325 MG per tablet 1 tablet  1 tablet Oral Q6H PRN Sabas Sous, MD       Current Outpatient Medications  Medication Sig Dispense Refill   folic acid (FOLVITE) 1 MG tablet Take 1 tablet (1 mg total) by mouth daily. (Patient not taking: Reported on 08/03/2022) 90 tablet 3   folic acid (FOLVITE) 1 MG tablet Take 1 tablet (1 mg total) by mouth daily. 30 tablet 11   ibuprofen (ADVIL) 800  MG tablet Take 1 tablet (800 mg total) by mouth every 8 (eight) hours as needed. 30 tablet 0   oxyCODONE-acetaminophen (PERCOCET/ROXICET) 5-325 MG tablet Take 1 tablet by mouth every 6 (six) hours as needed for moderate pain. 30 tablet 0    Musculoskeletal: Strength & Muscle Tone: within normal limits Gait & Station: normal Patient leans: Front   Psychiatric Specialty Exam: Presentation  General Appearance:  Casual  Eye Contact: Good  Speech: Clear and Coherent; Normal Rate  Speech Volume: Normal  Handedness: Right   Mood and Affect  Mood: Depressed  Affect: Congruent   Thought Process  Thought Processes: Coherent; Goal Directed  Descriptions of Associations:Intact  Orientation:Full (Time, Place and Person)  Thought Content:Logical  History of Schizophrenia/Schizoaffective disorder:No  Duration  of Psychotic Symptoms:No data recorded Hallucinations:Hallucinations: None  Ideas of Reference:None  Suicidal Thoughts:Suicidal Thoughts: No  Homicidal Thoughts:Homicidal Thoughts: No   Sensorium  Memory: Immediate Good; Recent Good; Remote Good  Judgment: Good  Insight: Good   Executive Functions  Concentration: Good  Attention Span: Good  Recall: Good  Fund of Knowledge: Good  Language: Good   Psychomotor Activity  Psychomotor Activity: Psychomotor Activity: Normal   Assets  Assets: Communication Skills; Desire for Improvement    Sleep  Sleep: Sleep: Poor   Physical Exam: Physical Exam Vitals and nursing note reviewed.  Constitutional:      Appearance: Normal appearance.  HENT:     Head: Normocephalic and atraumatic.     Nose: Nose normal.  Cardiovascular:     Rate and Rhythm: Normal rate and regular rhythm.  Pulmonary:     Effort: Pulmonary effort is normal.  Musculoskeletal:        General: Normal range of motion.     Cervical back: Normal range of motion.  Skin:    General: Skin is warm and dry.  Neurological:     Mental Status: He is alert and oriented to person, place, and time.    Review of Systems  Constitutional: Negative.   HENT: Negative.    Eyes: Negative.   Respiratory: Negative.    Cardiovascular: Negative.   Musculoskeletal: Negative.   Skin: Negative.   Neurological: Negative.   Endo/Heme/Allergies: Negative.   Psychiatric/Behavioral:  Positive for depression and suicidal ideas.    Blood pressure 139/89, pulse 82, temperature 98 F (36.7 C), temperature source Oral, resp. rate 16, height 5\' 9"  (1.753 m), weight 72.6 kg, SpO2 91 %. Body mass index is 23.63 kg/m.  Medical Decision Making: Patient is depressed and felt suicidal off and on for 5 years.  He is a danger to self being homeless and suffers from chronic condition Sickle Cell Anemia.  He meets criteria for inpatient Hospitalization for safety and  stabilization.  We will start low dose Lexapro.  Problem 1: Single episode Major Depressive disorder, without Psychotic features. Suicide ideation. Disposition:  Admit, seek be placement.  , NP-PMHNP-BC 08/29/2022 1:57 PM

## 2022-08-29 NOTE — ED Notes (Signed)
This RN called report to [facility] adult unit to Tiffany, RN who will be taking care of this patient.

## 2022-08-29 NOTE — Progress Notes (Signed)
Pt was accepted to [facility] TODAY 08/29/22 PENDING COVID PCT and signed vol consent faxed to [phone]  Pt meets inpatient criteria per Dahlia Byes, NP  Attending Physician will be Dr. Sherron Flemings  Report can be called to: -Adult unit: [phone]  Pt can arrive after [facility] Night shift will coordinate with care team  Care Team notified: [facility] Rona Ravens, RN, Dahlia Byes, NP, Felipa Furnace, RN, Pearlie Oyster, EMT-P  [place]cher, LCSWA 08/29/2022 @ 4:39 PM

## 2022-08-29 NOTE — ED Provider Notes (Signed)
[facility]-EMERGENCY DEPT [facility] Emergency Department Provider Note MRN:  [id number]  Arrival date & time: 08/29/22     Chief Complaint   Suicidal   History of Present Illness   William Gallegos is a 21 y.o. year-old male with a history of sickle cell anemia presenting to the ED with chief complaint of suicidal.  Patient having a lot of family stressors and is also homeless, wanted to end his life today.  Had access to a gun but it was taken away from him by police.  He called the suicide hotline and now he is here for evaluation.  He is also having some mild to moderate pain to his clavicle and right arm, similar to prior pain exacerbations related to his sickle cell anemia.  Denies any fever, no chest pain or shortness of breath, no abdominal pain.  Review of Systems  A thorough review of systems was obtained and all systems are negative except as noted in the HPI and PMH.   Patient's Health History    Past Medical History:  Diagnosis Date   Acute chest syndrome (HCC)    Seasonal allergies    Sickle cell anemia (HCC)     Past Surgical History:  Procedure Laterality Date   adnoidectomy     CHOLECYSTECTOMY     TONSILLECTOMY AND ADENOIDECTOMY      Family History  Problem Relation Age of Onset   Hypertension Other    Diabetes Other    Cancer Other    Stroke Other    Sickle cell anemia Brother    Sickle cell anemia Maternal Grandfather     Social History   Socioeconomic History   Marital status: Single    Spouse name: Not on file   Number of children: Not on file   Years of education: Not on file   Highest education level: Not on file  Occupational History   Not on file  Tobacco Use   Smoking status: Never   Smokeless tobacco: Never  Vaping Use   Vaping Use: Not on file  Substance and Sexual Activity   Alcohol use: No   Drug use: No   Sexual activity: Yes  Other Topics Concern   Not on file  Social History Narrative   Not on file   Social Determinants  of Health   Financial Resource Strain: Not on file  Food Insecurity: No Food Insecurity (08/04/2022)   Hunger Vital Sign    Worried About Running Out of Food in the Last Year: Never true    Ran Out of Food in the Last Year: Never true  Transportation Needs: No Transportation Needs (08/04/2022)   PRAPARE - Administrator, Civil Service (Medical): No    Lack of Transportation (Non-Medical): No  Physical Activity: Not on file  Stress: Not on file  Social Connections: Not on file  Intimate Partner Violence: Not At Risk (08/04/2022)   Humiliation, Afraid, Rape, and Kick questionnaire    Fear of Current or Ex-Partner: No    Emotionally Abused: No    Physically Abused: No    Sexually Abused: No     Physical Exam   Vitals:   08/29/22 0002  BP: (!) 148/103  Pulse: 96  Resp: 18  Temp: 97.9 F (36.6 C)  SpO2: 91%    CONSTITUTIONAL: Well-appearing, NAD NEURO/PSYCH:  Alert and oriented x 3, no focal deficits EYES:  eyes equal and reactive ENT/NECK:  no LAD, no JVD CARDIO: Regular rate, well-perfused, normal S1  and S2 PULM:  CTAB no wheezing or rhonchi GI/GU:  non-distended, non-tender MSK/SPINE:  No gross deformities, no edema SKIN:  no rash, atraumatic   *Additional and/or pertinent findings included in MDM below  Diagnostic and Interventional Summary    EKG Interpretation  Date/Time:    Ventricular Rate:    PR Interval:    QRS Duration:   QT Interval:    QTC Calculation:   R Axis:     Text Interpretation:         Labs Reviewed  COMPREHENSIVE METABOLIC PANEL - Abnormal; Notable for the following components:      Result Value   Sodium 133 (*)    CO2 21 (*)    Glucose, Bld 107 (*)    Creatinine, Ser 0.60 (*)    Calcium 8.4 (*)    Total Protein 8.2 (*)    Total Bilirubin 5.1 (*)    All other components within normal limits  SALICYLATE LEVEL - Abnormal; Notable for the following components:   Salicylate Lvl <7.0 (*)    All other components within  normal limits  ACETAMINOPHEN LEVEL - Abnormal; Notable for the following components:   Acetaminophen (Tylenol), Serum <10 (*)    All other components within normal limits  CBC - Abnormal; Notable for the following components:   WBC 19.0 (*)    RBC 2.95 (*)    Hemoglobin 8.3 (*)    HCT 23.6 (*)    RDW 24.4 (*)    Platelets 611 (*)    nRBC 0.8 (*)    All other components within normal limits  RAPID URINE DRUG SCREEN, HOSP PERFORMED - Abnormal; Notable for the following components:   Tetrahydrocannabinol POSITIVE (*)    All other components within normal limits  ETHANOL    No orders to display    Medications  oxyCODONE-acetaminophen (PERCOCET/ROXICET) 5-325 MG per tablet 1 tablet (has no administration in time range)     Procedures  /  Critical Care Procedures  ED Course and Medical Decision Making  Initial Impression and Ddx SI with concerning plan/behaviors today, needing police to take away his firearm.  Here voluntarily but would IVC if needed.  Having some mild pain typical of his sickle cell, providing some medicines.  Past medical/surgical history that increases complexity of ED encounter: Sickle cell anemia  Interpretation of Diagnostics I personally reviewed the laboratory assessment and my interpretation is as follows: Leukocytosis that is chronic and improved from prior, hemoglobin improved from prior as well, overall reassuring with no significant blood count or electrolyte disturbance    Patient Reassessment and Ultimate Disposition/Management     Patient to be evaluated by TTS for recommendations, signed out to default provider.  Patient management required discussion with the following services or consulting groups:  Psychiatry/TTS  Complexity of Problems Addressed Acute illness or injury that poses threat of life of bodily function  Additional Data Reviewed and Analyzed Further history obtained from: Prior labs/imaging results  Additional Factors  Impacting ED Encounter Risk Consideration of hospitalization  Elmer Sow. Pilar Plate, MD [facility] Emergency Medicine [facility] [email or web address]  Final Clinical Impressions(s) / ED Diagnoses     ICD-10-CM   1. Suicidal ideation  R45.851       ED Discharge Orders     None        Discharge Instructions Discussed with and Provided to Patient:   Discharge Instructions   None      Sabas Sous, MD 08/29/22 [phone]

## 2022-08-29 NOTE — ED Notes (Signed)
Patient wanded by security, all belonging secured in patient suitcase and one belongings bag. Both labelled with patient stickers.

## 2022-08-29 NOTE — ED Notes (Signed)
Patient changed into yellow socks, burgundy top and blue bottoms due to lack of supply in department.
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
## 2022-08-29 NOTE — ED Provider Notes (Signed)
Emergency Medicine Observation Re-evaluation Note  William Gallegos is a 21 y.o. male, seen on rounds today.  Pt initially presented to the ED for complaints of Suicidal Currently, the patient is here voluntarily for SI however would consider IVC if needed. Marland Kitchen  Physical Exam  BP (!) 148/103 (BP Location: Right Arm)   Pulse 96   Temp 97.9 F (36.6 C) (Oral)   Resp 18   Ht 5\' 9"  (1.753 m)   Wt 72.6 kg   SpO2 91%   BMI 23.63 kg/m  Physical Exam General: sleeping, NAD Cardiac: RR Lungs: normal rise and fall Psych: n/a sleeping  ED Course / MDM  EKG:   I have reviewed the labs performed to date as well as medications administered while in observation.  Recent changes in the last 24 hours include-arrived to ED, medically cleared.  Plan  Current plan is for inpatient care.    , MD 08/29/22 1359

## 2022-08-30 ENCOUNTER — Encounter (HOSPITAL_COMMUNITY): Payer: Self-pay

## 2022-08-30 ENCOUNTER — Other Ambulatory Visit: Payer: Self-pay

## 2022-08-30 ENCOUNTER — Encounter (HOSPITAL_COMMUNITY): Payer: Self-pay | Admitting: Nurse Practitioner

## 2022-08-30 DIAGNOSIS — F1220 Cannabis dependence, uncomplicated: Secondary | ICD-10-CM

## 2022-08-30 DIAGNOSIS — F411 Generalized anxiety disorder: Secondary | ICD-10-CM

## 2022-08-30 DIAGNOSIS — F332 Major depressive disorder, recurrent severe without psychotic features: Secondary | ICD-10-CM

## 2022-08-30 LAB — URINALYSIS, ROUTINE W REFLEX MICROSCOPIC
Bacteria, UA: NONE SEEN
Bilirubin Urine: NEGATIVE
Glucose, UA: NEGATIVE mg/dL
Ketones, ur: NEGATIVE mg/dL
Leukocytes,Ua: NEGATIVE
Nitrite: NEGATIVE
Protein, ur: >=300 mg/dL — AB
Specific Gravity, Urine: 1.013 (ref 1.005–1.030)
pH: 6 (ref 5.0–8.0)

## 2022-08-30 MED ORDER — HYDROXYUREA 500 MG PO CAPS: 15.0000 mg/kg | ORAL_CAPSULE | Freq: Every day | ORAL | Status: DC

## 2022-08-30 MED ORDER — HYDROXYUREA 500 MG PO CAPS
15.0000 mg/kg | ORAL_CAPSULE | Freq: Every day | ORAL | Status: DC
  Administered 2022-08-31 – 2022-09-07 (×8): 1000 mg via ORAL
  Filled 2022-08-30: qty 20
  Filled 2022-08-30: qty 2
  Filled 2022-08-30: qty 20
  Filled 2022-08-30 (×8): qty 2

## 2022-08-30 MED ORDER — TRAZODONE HCL 50 MG PO TABS
50.0000 mg | ORAL_TABLET | Freq: Every evening | ORAL | Status: DC | PRN
  Administered 2022-08-30 – 2022-09-03 (×4): 50 mg via ORAL
  Filled 2022-08-30 (×3): qty 1
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
## 2022-08-30 NOTE — Progress Notes (Signed)
Patient a&Ox4. Quiet but cooperative and med compliant. Patient denies SI,HI, and A/V/H with no plan/intent although reports feeling depressed due to current homeless situation. Patient reports constant pain on arms and chest and received oxycodone x2 and tylenol throughout day thus far. Patient compliant with UA and collected. Patient remains safe on unit with q15 minutes safety checks in place.     08/30/22 0828  Psych Admission Type (Psych Patients Only)  Admission Status Voluntary  Psychosocial Assessment  Patient Complaints Malaise;Depression  Eye Contact Fair  Facial Expression Flat  Affect Appropriate to circumstance  Speech Logical/coherent  Interaction Assertive  Motor Activity Other (Comment) (WDL)  Appearance/Hygiene In scrubs  Behavior Characteristics Cooperative;Appropriate to situation  Mood Depressed;Pleasant  Thought Process  Coherency WDL  Content WDL  Delusions None reported or observed  Perception WDL  Hallucination None reported or observed  Judgment Limited  Confusion None  Danger to Self  Current suicidal ideation? Denies  Agreement Not to Harm Self Yes  Description of Agreement Verbally contracts for safety  Danger to Others  Danger to Others None reported or observed
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
## 2022-08-30 NOTE — Group Note (Signed)
Recreation Therapy Group Note   Group Topic:Team Building  Group Date: 08/30/2022 Start Time: 0930 End Time: 0955 Facilitators: William Gallegos, LRT,CTRS Location: [address] Dayroom   Goal Area(s) Addresses:  Patient will effectively work with peer towards shared goal.  Patient will identify skills used to make activity successful.  Patient will identify how skills used during activity can be used to reach post d/c goals.   Group Description: Landing Pad. In teams of 3-5, patients were given 12 plastic drinking straws and an equal length of masking tape. Using the materials provided, patients were asked to build a landing pad to catch a golf ball dropped from approximately 5 feet in the air. All materials were required to be used by the team in their design. LRT facilitated post-activity discussion.   Affect/Mood: Flat   Participation Level: None   Participation Quality: None   Behavior: Reserved   Speech/Thought Process: Barely audible    Insight: N/A   Judgement: N/A   Modes of Intervention: STEM Activity   Patient Response to Interventions:  Disengaged   Education Outcome:  Acknowledges education and In group clarification offered    Clinical Observations/Individualized Feedback: Pt came into group a short while.  Pt observed for a little before leaving and not returning.    Plan: Continue to engage patient in RT group sessions 2-3x/week.   William Gallegos, LRT,CTRS 08/30/2022 12:19 PM

## 2022-08-30 NOTE — Progress Notes (Signed)
   08/30/22 0548  Sleep  Number of Hours 4

## 2022-08-30 NOTE — Progress Notes (Signed)
Patient is a  21 year old male admitted from  [facility] voluntary. Pt admitted for Suicidal Ideation Pt stated feeling increase in SI thoughts within last month but have been feeling suicidal for 5 years . Stressors include family issues and being homeless, PMHX of sickle cell anemia that has patient in chronic pain Pt denies verbal/emotional/physical or sexual abuse history. Pt denies SI/HI/AVH. Pt reports folic acid, oxycodone,Ibuprofen  as home medications. Admission and skin assessment completed. Patient belongings listed and secured. Patient stable at this time. Patient given the opportunity to express concerns and ask questions. Patient given toiletries. Patient settled onto unit. 15 minutes checks initiated.
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
## 2022-08-30 NOTE — Progress Notes (Signed)
Rx Brief note: Hydrea  Hydroxyurea (Hydrea) hold criteria: ANC < 2 Pltc < 80K in sickle-cell patients; < 100K in other patients Hgb <= 6 in sickle-cell patients; < 8 in other patients Reticulocytes < 80K when Hgb < 9    Patients labs are acceptable to continue Hydrea as ordered.   Thanks! Lorenza Evangelist 08/30/2022 3:11 PM

## 2022-08-30 NOTE — Plan of Care (Signed)
  Problem: Sensory: Goal: Pain level will decrease with appropriate interventions 08/30/2022 1215 by Roseanne Reno, RN Outcome: Progressing 08/30/2022 1211 by Roseanne Reno, RN Outcome: Progressing   Problem: Health Behavior: Goal: Postive changes in compliance with treatment and prescription regimens will improve Outcome: Progressing   Problem: Safety: Goal: Periods of time without injury will increase Outcome: Progressing

## 2022-08-30 NOTE — BH IP Treatment Plan (Signed)
Interdisciplinary Treatment and Diagnostic Plan Update  08/30/2022 Time of Session: 10:05am  William Gallegos MRN: [id number]  Principal Diagnosis: Major depressive disorder, single episode, severe ([place])  Secondary Diagnoses: Principal Problem:   Major depressive disorder, single episode, severe ([place]) Active Problems:   Sickle cell anemia (HCC)   Hyperbilirubinemia   Current Medications:  Current Facility-Administered Medications  Medication Dose Route Frequency Provider Last Rate Last Admin   acetaminophen (TYLENOL) tablet 650 mg  650 mg Oral Q6H PRN Charmaine Downs C, NP   650 mg at 08/30/22 0828   alum & mag hydroxide-simeth (MAALOX/MYLANTA) 200-200-20 MG/5ML suspension 30 mL  30 mL Oral Q4H PRN Charmaine Downs C, NP       escitalopram (LEXAPRO) tablet 5 mg  5 mg Oral Daily Onuoha, Josephine C, NP   5 mg at 94/85/46 2703   folic acid (FOLVITE) tablet 1 mg  1 mg Oral Daily Onuoha, Josephine C, NP   1 mg at 08/30/22 5009   magnesium hydroxide (MILK OF MAGNESIA) suspension 30 mL  30 mL Oral Daily PRN Charmaine Downs C, NP       oxyCODONE-acetaminophen (PERCOCET/ROXICET) 5-325 MG per tablet 1 tablet  1 tablet Oral Q6H PRN Charmaine Downs C, NP   1 tablet at 08/30/22 1251   PTA Medications: No medications prior to admission.    Patient Stressors:    Patient Strengths:    Treatment Modalities: Medication Management, Group therapy, Case management,  1 to 1 session with clinician, Psychoeducation, Recreational therapy.   Physician Treatment Plan for Primary Diagnosis: Major depressive disorder, single episode, severe ([place]) Long Term Goal(s):     Short Term Goals:    Medication Management: Evaluate patient's response, side effects, and tolerance of medication regimen.  Therapeutic Interventions: 1 to 1 sessions, Unit Group sessions and Medication administration.  Evaluation of Outcomes: Not Met  Physician Treatment Plan for Secondary Diagnosis: Principal Problem:    Major depressive disorder, single episode, severe (HCC) Active Problems:   Sickle cell anemia (HCC)   Hyperbilirubinemia  Long Term Goal(s):     Short Term Goals:       Medication Management: Evaluate patient's response, side effects, and tolerance of medication regimen.  Therapeutic Interventions: 1 to 1 sessions, Unit Group sessions and Medication administration.  Evaluation of Outcomes: Not Met   RN Treatment Plan for Primary Diagnosis: Major depressive disorder, single episode, severe ([place]) Long Term Goal(s): Knowledge of disease and therapeutic regimen to maintain health will improve  Short Term Goals: Ability to remain free from injury will improve, Ability to participate in decision making will improve, Ability to verbalize feelings will improve, Ability to disclose and discuss suicidal ideas, and Ability to identify and develop effective coping behaviors will improve  Medication Management: RN will administer medications as ordered by provider, will assess and evaluate patient's response and provide education to patient for prescribed medication. RN will report any adverse and/or side effects to prescribing provider.  Therapeutic Interventions: 1 on 1 counseling sessions, Psychoeducation, Medication administration, Evaluate responses to treatment, Monitor vital signs and CBGs as ordered, Perform/monitor CIWA, COWS, AIMS and Fall Risk screenings as ordered, Perform wound care treatments as ordered.  Evaluation of Outcomes: Not Met   LCSW Treatment Plan for Primary Diagnosis: Major depressive disorder, single episode, severe ([place]) Long Term Goal(s): Safe transition to appropriate next level of care at discharge, Engage patient in therapeutic group addressing interpersonal concerns.  Short Term Goals: Engage patient in aftercare planning with referrals and resources, Increase social support,  Increase emotional regulation, Facilitate acceptance of mental health diagnosis and  concerns, Identify triggers associated with mental health/substance abuse issues, and Increase skills for wellness and recovery  Therapeutic Interventions: Assess for all discharge needs, 1 to 1 time with Social worker, Explore available resources and support systems, Assess for adequacy in community support network, Educate family and significant other(s) on suicide prevention, Complete Psychosocial Assessment, Interpersonal group therapy.  Evaluation of Outcomes: Not Met   Progress in Treatment: Attending groups: Yes. Participating in groups: Yes. Taking medication as prescribed: Yes. Toleration medication: Yes. Family/Significant other contact made: No, will contact:  Patient declined consents  Patient understands diagnosis: Yes. Discussing patient identified problems/goals with staff: Yes. Medical problems stabilized or resolved: Yes. Denies suicidal/homicidal ideation: Yes. Issues/concerns per patient self-inventory: No.   New problem(s) identified: No, Describe:  None   New Short Term/Long Term Goal(s): medication stabilization, elimination of SI thoughts, development of comprehensive mental wellness plan.   Patient Goals: ""I am not sure yet"   Discharge Plan or Barriers: Patient recently admitted. CSW will continue to follow and assess for appropriate referrals and possible discharge planning.   Reason for Continuation of Hospitalization: Anxiety Depression Medication stabilization Suicidal ideation  Estimated Length of Stay: 3 to 7 days   Last 3 [place] Suicide Severity Risk Score: [place] Admission (Current) from 08/29/2022 in [facility] ED from 08/28/2022 in [place] DEPT ED to Hosp-Admission (Discharged) from 08/03/2022 in [facility] CATEGORY High Risk High Risk No Risk       Last PHQ 2/9 Scores:    08/05/2019    9:37 AM 04/29/2019    10:00 AM  Depression screen PHQ 2/9  Decreased Interest 0 0  Down, Depressed, Hopeless 0 0  PHQ - 2 Score 0 0    Scribe for Treatment Team: Darleen Crocker, Latanya Presser 08/30/2022 1:46 PM

## 2022-08-31 DIAGNOSIS — F332 Major depressive disorder, recurrent severe without psychotic features: Secondary | ICD-10-CM | POA: Diagnosis not present

## 2022-08-31 DIAGNOSIS — F1220 Cannabis dependence, uncomplicated: Secondary | ICD-10-CM | POA: Diagnosis not present

## 2022-08-31 DIAGNOSIS — F411 Generalized anxiety disorder: Secondary | ICD-10-CM | POA: Diagnosis not present

## 2022-08-31 LAB — HEPATIC FUNCTION PANEL
ALT: 17 U/L (ref 0–44)
AST: 27 U/L (ref 15–41)
Albumin: 3.4 g/dL — ABNORMAL LOW (ref 3.5–5.0)
Alkaline Phosphatase: 53 U/L (ref 38–126)
Bilirubin, Direct: 0.4 mg/dL — ABNORMAL HIGH (ref 0.0–0.2)
Indirect Bilirubin: 3.4 mg/dL — ABNORMAL HIGH (ref 0.3–0.9)
Total Bilirubin: 3.8 mg/dL — ABNORMAL HIGH (ref 0.3–1.2)
Total Protein: 7.5 g/dL (ref 6.5–8.1)

## 2022-08-31 LAB — CBC
HCT: 22.9 % — ABNORMAL LOW (ref 39.0–52.0)
Hemoglobin: 8 g/dL — ABNORMAL LOW (ref 13.0–17.0)
MCH: 28.6 pg (ref 26.0–34.0)
MCHC: 34.9 g/dL (ref 30.0–36.0)
MCV: 81.8 fL (ref 80.0–100.0)
Platelets: 441 10*3/uL — ABNORMAL HIGH (ref 150–400)
RBC: 2.8 MIL/uL — ABNORMAL LOW (ref 4.22–5.81)
RDW: 25.5 % — ABNORMAL HIGH (ref 11.5–15.5)
WBC: 16.2 10*3/uL — ABNORMAL HIGH (ref 4.0–10.5)
nRBC: 1.1 % — ABNORMAL HIGH (ref 0.0–0.2)

## 2022-08-31 LAB — RETICULOCYTES
Immature Retic Fract: 29.1 % — ABNORMAL HIGH (ref 2.3–15.9)
RBC.: 2.79 MIL/uL — ABNORMAL LOW (ref 4.22–5.81)
Retic Count, Absolute: 332.5 10*3/uL — ABNORMAL HIGH (ref 19.0–186.0)
Retic Ct Pct: 11.9 % — ABNORMAL HIGH (ref 0.4–3.1)

## 2022-08-31 LAB — FERRITIN: Ferritin: 330 ng/mL (ref 24–336)

## 2022-08-31 LAB — IRON AND TIBC
Iron: 80 ug/dL (ref 45–182)
Saturation Ratios: 36 % (ref 17.9–39.5)
TIBC: 220 ug/dL — ABNORMAL LOW (ref 250–450)
UIBC: 140 ug/dL

## 2022-08-31 MED ORDER — ESCITALOPRAM OXALATE 10 MG PO TABS
10.0000 mg | ORAL_TABLET | Freq: Every day | ORAL | Status: DC
  Administered 2022-09-01: 10 mg via ORAL
  Filled 2022-08-31 (×2): qty 1

## 2022-08-31 NOTE — Progress Notes (Signed)
   08/30/22 2300  Psych Admission Type (Psych Patients Only)  Admission Status Voluntary  Psychosocial Assessment  Patient Complaints Malaise  Eye Contact Fair  Facial Expression Flat  Affect Appropriate to circumstance  Speech Logical/coherent  Interaction Assertive  Motor Activity Slow  Appearance/Hygiene In scrubs  Behavior Characteristics Cooperative  Mood Pleasant  Thought Process  Coherency WDL  Content WDL  Delusions None reported or observed  Perception WDL  Hallucination None reported or observed  Judgment Limited  Confusion None  Danger to Self  Current suicidal ideation? Denies  Agreement Not to Harm Self Yes  Description of Agreement Verbal  Danger to Others  Danger to Others None reported or observed    

## 2022-08-31 NOTE — Progress Notes (Signed)
Adult Psychoeducational Group Note  Date:  08/31/2022 Time:  9:28 PM  Group Topic/Focus:  Wrap-Up Group:   The focus of this group is to help patients review their daily goal of treatment and discuss progress on daily workbooks.  Participation Level:  Active  Participation Quality:  Appropriate  Affect:  Appropriate  Cognitive:  Appropriate  Insight: Appropriate  Engagement in Group:  Engaged  Modes of Intervention:  Education and Exploration  Additional Comments:  Patient attended and participated in group tonight. Patient reports that he is learning how to participate in group, how to get out of his shell.  Lita Mains [facility] 08/31/2022, 9:28 PM

## 2022-08-31 NOTE — Progress Notes (Signed)
Pt presents with flat affect. Pt compliant with medication. Pt receiving tylenol and percocet for collarbone pain which pt states is related to sickle cell. Pt endorses depression, denies suicidal thoughts. Q 15 minute checks ongoing for safety.

## 2022-08-31 NOTE — Progress Notes (Signed)
D- Patient alert and oriented.   Denies SI, HI, AVH, and pain.  A- PRN medications administered to patient, patient states that he wasn't able to sleep. Reports pain was controlled from pain medications. Support and encouragement provided.  Routine safety checks conducted every 15 minutes.  Patient informed to notify staff with problems or concerns. R- No adverse drug reactions noted. Patient contracts for safety at this time. Patient compliant with medications and treatment plan. Patient receptive, calm, and cooperative. Patient interacts well with others on the unit.  Patient remains safe at this time.

## 2022-08-31 NOTE — Progress Notes (Signed)
   08/30/22 2300  Psych Admission Type (Psych Patients Only)  Admission Status Voluntary  Psychosocial Assessment  Patient Complaints Malaise  Eye Contact Fair  Facial Expression Flat  Affect Appropriate to circumstance  Speech Logical/coherent  Interaction Assertive  Motor Activity Slow  Appearance/Hygiene In scrubs  Behavior Characteristics Cooperative  Mood Pleasant  Thought Process  Coherency WDL  Content WDL  Delusions None reported or observed  Perception WDL  Hallucination None reported or observed  Judgment Limited  Confusion None  Danger to Self  Current suicidal ideation? Denies  Agreement Not to Harm Self Yes  Description of Agreement Verbal  Danger to Others  Danger to Others None reported or observed

## 2022-08-31 NOTE — Progress Notes (Signed)
   08/31/22 2200  Psych Admission Type (Psych Patients Only)  Admission Status Voluntary  Psychosocial Assessment  Patient Complaints Malaise;Depression  Eye Contact Fair  Facial Expression Flat  Affect Sad  Speech Logical/coherent  Interaction Assertive  Motor Activity Slow  Appearance/Hygiene In scrubs  Behavior Characteristics Cooperative  Mood Pleasant  Thought Process  Coherency WDL  Content WDL  Delusions None reported or observed  Perception WDL  Hallucination None reported or observed  Judgment Limited  Confusion None  Danger to Self  Current suicidal ideation? Denies  Agreement Not to Harm Self Yes  Description of Agreement VERBAL  Danger to Others  Danger to Others None reported or observed

## 2022-08-31 NOTE — Progress Notes (Addendum)
[facility] MD Progress Note  08/31/2022 8:19 AM William Gallegos  MRN:  [id number] Principal Problem: Major depressive disorder, single episode, severe (HCC) Diagnosis: Principal Problem:   Major depressive disorder, single episode, severe (HCC) Active Problems:   Sickle cell anemia (HCC)   Hyperbilirubinemia   Reason for Admission: William Gallegos is a 21 yo male w/ hx of sickle cell disease type SS c/b multiple admissions for sickle cell crisis, sickle cell anemia, acute chest syndrome and hyperbilirubinemia, homelessness, cannabis use disorder and no known PPHx presenting from [facility] due to SI with plan to shoot himself with gun he had confiscated by police.  This is hospitalization day 2.    Subjective:  Patient seen and assessed at bedside.  Patient reports passive suicidal ideation still but is able to contract for safety while on the unit.  He denies HI/AVH.  He reports continued depression that has mildly improved with Lexapro.  He endorses going to groups.  He states that he did not sleep well last night due to drinking coffee late at night.  He reports anxiety is still present but also slightly improved.  He reports some pain around his collarbone that travels around his body.  We discussed getting labs this morning but patient states that the laboratory did not come and draw his blood.  He reports appetite is appropriate.  Reports energy levels appropriate.  He has no other acute concerns.  He denies any further somatic complaints from initiation of Lexapro.  Patient agreeable to increase in Lexapro to 10 mg.  Objective:  Chart Review Past 24 hours of patient's chart was reviewed.  Patient is compliant with scheduled meds. Required Agitation PRNs: none Per RN notes, no documented behavioral issues and is attending group. Patient slept, 3.2 hours  Total Time spent with patient: 45 minutes  Past Psychiatric History:  Previous Psych Diagnoses: none Prior inpatient treatment:  denies Current/prior outpatient treatment:denies Psychotherapy CB:ULAG History of suicide:none, chronic SI for several years History of homicide: none Psychiatric medication history:none Psychiatric medication compliance history:n/a Neuromodulation history: none Current Psychiatrist:none Current therapist: none  Past Medical History:  Past Medical History:  Diagnosis Date   Acute chest syndrome (HCC)    Seasonal allergies    Sickle cell anemia (HCC)     Past Surgical History:  Procedure Laterality Date   adnoidectomy     CHOLECYSTECTOMY     TONSILLECTOMY AND ADENOIDECTOMY     Family History:  Family History  Problem Relation Age of Onset   Hypertension Other    Diabetes Other    Cancer Other    Stroke Other    Sickle cell anemia Brother    Sickle cell anemia Maternal Grandfather    Family Psychiatric  History: Psych: brother with likely depression SA/HA: brother attempted suicide ~5 years ago Social History:  Social History   Substance and Sexual Activity  Alcohol Use No     Social History   Substance and Sexual Activity  Drug Use No    Social History   Socioeconomic History   Marital status: Single    Spouse name: Not on file   Number of children: Not on file   Years of education: Not on file   Highest education level: Not on file  Occupational History   Not on file  Tobacco Use   Smoking status: Never   Smokeless tobacco: Never  Vaping Use   Vaping Use: Not on file  Substance and Sexual Activity   Alcohol use: No  Drug use: No   Sexual activity: Yes  Other Topics Concern   Not on file  Social History Narrative   Not on file   Social Determinants of Health   Financial Resource Strain: Not on file  Food Insecurity: No Food Insecurity (08/30/2022)   Hunger Vital Sign    Worried About Running Out of Food in the Last Year: Never true    Ran Out of Food in the Last Year: Never true  Transportation Needs: No Transportation Needs (08/30/2022)    PRAPARE - Hydrologist (Medical): No    Lack of Transportation (Non-Medical): No  Physical Activity: Not on file  Stress: Not on file  Social Connections: Not on file   Additional Social History:                         Current Medications: Current Facility-Administered Medications  Medication Dose Route Frequency Provider Last Rate Last Admin   acetaminophen (TYLENOL) tablet 650 mg  650 mg Oral Q6H PRN Charmaine Downs C, NP   650 mg at 08/31/22 0645   alum & mag hydroxide-simeth (MAALOX/MYLANTA) 200-200-20 MG/5ML suspension 30 mL  30 mL Oral Q4H PRN Delfin Gant, NP       [START ON 09/01/2022] escitalopram (LEXAPRO) tablet 10 mg  10 mg Oral Daily France Ravens, MD       folic acid (FOLVITE) tablet 1 mg  1 mg Oral Daily Charmaine Downs C, NP   1 mg at 08/30/22 [id number]   hydroxyurea (HYDREA) capsule 1,000 mg  15 mg/kg Oral Daily France Ravens, MD       magnesium hydroxide (MILK OF MAGNESIA) suspension 30 mL  30 mL Oral Daily PRN Charmaine Downs C, NP       oxyCODONE-acetaminophen (PERCOCET/ROXICET) 5-325 MG per tablet 1 tablet  1 tablet Oral Q6H PRN Delfin Gant, NP   1 tablet at 08/31/22 0252   traZODone (DESYREL) tablet 50 mg  50 mg Oral QHS PRN Onuoha, Chinwendu V, NP   50 mg at 08/30/22 2343    Lab Results:  Results for orders placed or performed during the Gallegos encounter of 08/29/22 (from the past 24 hour(s))  Urinalysis, Routine w reflex microscopic Urine, Clean Catch     Status: Abnormal   Collection Time: 08/30/22  2:49 PM  Result Value Ref Range   Color, Urine YELLOW YELLOW   APPearance CLEAR CLEAR   Specific Gravity, Urine 1.013 1.005 - 1.030   pH 6.0 5.0 - 8.0   Glucose, UA NEGATIVE NEGATIVE mg/dL   Hgb urine dipstick SMALL (A) NEGATIVE   Bilirubin Urine NEGATIVE NEGATIVE   Ketones, ur NEGATIVE NEGATIVE mg/dL   Protein, ur >=300 (A) NEGATIVE mg/dL   Nitrite NEGATIVE NEGATIVE   Leukocytes,Ua NEGATIVE NEGATIVE   RBC  / HPF 0-5 0 - 5 RBC/hpf   WBC, UA 0-5 0 - 5 WBC/hpf   Bacteria, UA NONE SEEN NONE SEEN   Mucus PRESENT    Hyaline Casts, UA PRESENT     Blood Alcohol level:  Lab Results  Component Value Date   ETH <10 [id number]    Metabolic Disorder Labs: No results found for: "HGBA1C", "MPG" No results found for: "PROLACTIN" No results found for: "CHOL", "TRIG", "HDL", "CHOLHDL", "VLDL", "LDLCALC"  Physical Findings:  Musculoskeletal: Strength & Muscle Tone: within normal limits Gait & Station: normal  Psychiatric Specialty Exam:  Presentation  General Appearance:  Casual   Eye Contact:  Good   Speech: Clear and Coherent; Normal Rate   Speech Volume: Normal   Handedness: Right    Mood and Affect  Mood: Depressed; Anxious   Affect: Appropriate; Congruent    Thought Process  Thought Processes: Coherent; Goal Directed   Descriptions of Associations:Intact   Orientation:Full (Time, Place and Person)   Thought Content:Logical   History of Schizophrenia/Schizoaffective disorder:No   Duration of Psychotic Symptoms: none  Hallucinations:Hallucinations: None  Ideas of Reference:None   Suicidal Thoughts:Suicidal Thoughts: Yes, Passive  Homicidal Thoughts:Homicidal Thoughts: No   Sensorium  Memory: Immediate Good; Recent Good; Remote Good   Judgment: Fair   Insight: Poor    Executive Functions  Concentration: Good   Attention Span: Good   Recall: Fair   Fund of Knowledge: Fair   Language: Fair    Psychomotor Activity  Psychomotor Activity: Psychomotor Activity: Decreased   Assets  Assets: Communication Skills; Desire for Improvement    Sleep  Sleep: Sleep: Fair    Physical Exam: Review of Systems  Respiratory:  Negative for shortness of breath.   Cardiovascular:  Negative for chest pain.  Gastrointestinal:  Negative for abdominal pain, constipation, diarrhea, heartburn, nausea and vomiting.   Neurological:  Negative for headaches.   Blood pressure 135/89, pulse (!) 122, temperature 98.9 F (37.2 C), temperature source Oral, resp. rate 16, height 5\' 9"  (1.753 m), weight 68.9 kg, SpO2 94 %. Body mass index is 22.45 kg/m.   ASSESSMENT AND PLAN Jourdon Laflash is a 21 yo male w/ hx of sickle cell disease type SS c/b multiple admissions for sickle cell crisis, sickle cell anemia, acute chest syndrome and hyperbilirubinemia, homelessness, cannabis use disorder and no known PPHx presenting from [facility] due to [place] with plan to shoot himself with gun he had confiscated by police.  This is hospitalization day 2.  Patient reports mild improvement with Lexapro initiation.  Continues to be passively suicidal but is able to contract for safety on the unit.  Plan for increasing Lexapro.  He reports good effect from trazodone when he had problems sleeping last night.  Will continue trazodone.  Has not required any as needed medications for psychiatric problems.  Required 3 Percocet dosages over past 24 hours due to sickle cell pain.  PLAN Safety and Monitoring: Voluntary admission to inpatient psychiatric unit for safety, stabilization and treatment Daily contact with patient to assess and evaluate symptoms and progress in treatment Patient's case to be discussed in multi-disciplinary team meeting Observation Level : q15 minute checks Vital signs: q12 hours Precautions: suicide, elopement, and assault  Major Depressive Disorder, Recurrent episode, severe, without psychotic features Generalized anxiety disorder Cannabis use disorder --INCREASE Lexapro to 10 mg for depressive symptoms and anxiety --Encourage cannabis cessation -- The risks/benefits/side-effects/alternatives to this medication were discussed in detail with the patient and time was given for questions. The patient consents to medication trial.  -- Encouraged patient to participate in unit milieu and in scheduled group therapies     3. Medical Management Sickle Cell Disease Thrombocytosis Leucocytosis Anemia Spoke with Dorena Dew, FNP regarding recommendations and she thought it would be reasonable to restart hydroxyurea. He will need close follow up to ensure he can get patient assistance.  Baseline Hgb between 7-8. WBC elevated and CBC -Folvite 1 mg daily  -Hydroxyurea 1000 mg daily to reduce frequency of sickle cell crisis -Repeat CBC and hepatic function panel to ensure no worsening signs leading to sickle cell crisis   PRN The following PRN medications were  added to ensure patient can focus on treatment. These were discussed with patient and patient aware of ability to ask for the following medications:  -Tylenol 650 mg q6hr PRN for mild pain -Mylanta 30 ml suspension for indigestion -Milk of Magnesia 30 ml for constipation -Trazodone 50 mg qhs for insomnia -Hydroxyzine 25 mg tid PRN for anxiety   4. Discharge Planning Patient will require the following based on my assessment:  Greatly appreciate CSW and Case management assistance with facilitating these needs and any further recommendations regarding patient's needs upon discharge. Estimated LOS: 5-7 days Discharge Concerns: Need to establish a safety plan; Medication compliance and effectiveness Discharge Goals: Return home with outpatient referrals for mental health follow-up including medication management/psychotherapy     Park Pope, MD 08/31/2022, 8:19 AM

## 2022-09-01 DIAGNOSIS — F411 Generalized anxiety disorder: Secondary | ICD-10-CM | POA: Diagnosis not present

## 2022-09-01 DIAGNOSIS — F1220 Cannabis dependence, uncomplicated: Secondary | ICD-10-CM | POA: Diagnosis not present

## 2022-09-01 DIAGNOSIS — F332 Major depressive disorder, recurrent severe without psychotic features: Secondary | ICD-10-CM | POA: Diagnosis not present

## 2022-09-01 MED ORDER — ESCITALOPRAM OXALATE 5 MG PO TABS
15.0000 mg | ORAL_TABLET | Freq: Every day | ORAL | Status: DC
  Administered 2022-09-02 – 2022-09-03 (×2): 15 mg via ORAL
  Filled 2022-09-01 (×4): qty 1

## 2022-09-01 NOTE — Progress Notes (Signed)
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     Yesterday the psych team made the following recommendations: --INCREASE Lexapro to 10 mg for depressive symptoms and anxiety    Patient was seen at the bedside by writer.  Patient reports the Lexapro is helping him with his mood and energy. Despite some improvement in mood, he continues to report that his depression is severe. Reports anhedonia. Reports that sleep is better. Reports that appetite is okay. Concentration is better. Reports anxiety is moderate (less severe than depression but bothersome).  Patient has been interacting with the other patients on the unit. Patient is concerned about not having housing. Patient reports he has resentment towards his mother due to his housing situation, but he refuses to call her for assistance. Patient reports he had an anger problem when he was living with his mother. He would like to find employment he reports when he is not working it causes him to be depressed. Patient reports when he is working he smiles more and feels better about himself. He continues to report having passive Si on my exam today w/o intent or plan.  Reports having passive HI towards mother's ex bf, w/I intent, w/o plan and reprots he does not know where this person is or hjhow to contact him, or even if this person is in the state of [place]. Denies psychotic symptoms. Denies s/e to current schedule psych meds.   He reports the medication that he is taking for the diagnosis of Sickle Cell is helping as well. Patient states his pain is reduced from a "6" on  yesterday to a "3" on today a 0-10-point scale. Patient received Oxycodone this morning. He reports he has been living on the street off and on for the last 3 years.   Principal Problem: Major depressive disorder, single episode, severe (HCC) Diagnosis: Principal Problem:   Major depressive disorder, single episode, severe (HCC) Active Problems:   Sickle cell anemia (HCC)   Hyperbilirubinemia  Total Time spent with patient: 15 minutes  Past Psychiatric History:  Previous Psych Diagnoses: none Prior inpatient treatment: denies Current/prior outpatient treatment:denies Psychotherapy [id number] History of suicide:none, chronic SI for several years History of homicide: none Psychiatric medication history:none Psychiatric medication compliance history:n/a Neuromodulation history: none Current Psychiatrist:none Current therapist: none    Past Medical History:  Past Medical History:  Diagnosis Date   Acute chest syndrome (HCC)    Seasonal allergies    Sickle cell anemia (HCC)     Past Surgical History:  Procedure Laterality Date   adnoidectomy     CHOLECYSTECTOMY     TONSILLECTOMY AND ADENOIDECTOMY     Family History:  Family History  Problem Relation Age of Onset   Hypertension Other    Diabetes Other    Cancer Other    Stroke Other    Sickle cell anemia Brother    Sickle cell anemia Maternal Grandfather    Family Psychiatric  History: brother with likely depression SA/HA: brother attempted suicide ~5 years ago    Social History:  Social History   Substance and Sexual Activity  Alcohol  Use No     Social History   Substance and Sexual Activity  Drug Use No    Social History   Socioeconomic History   Marital status: Single    Spouse name: Not on file   Number of children: Not on file   Years of education: Not on file   Highest education level: Not on file  Occupational History   Not on file  Tobacco Use   Smoking status: Never   Smokeless tobacco:  Never  Vaping Use   Vaping Use: Not on file  Substance and Sexual Activity   Alcohol use: No   Drug use: No   Sexual activity: Yes  Other Topics Concern   Not on file  Social History Narrative   Not on file   Social Determinants of Health   Financial Resource Strain: Not on file  Food Insecurity: No Food Insecurity (08/30/2022)   Hunger Vital Sign    Worried About Running Out of Food in the Last Year: Never true    Ran Out of Food in the Last Year: Never true  Transportation Needs: No Transportation Needs (08/30/2022)   PRAPARE - Administrator, Civil Service (Medical): No    Lack of Transportation (Non-Medical): No  Physical Activity: Not on file  Stress: Not on file  Social Connections: Not on file   Additional Social History:                         Sleep: Fair  Appetite:  Fair  Current Medications: Current Facility-Administered Medications  Medication Dose Route Frequency Provider Last Rate Last Admin   acetaminophen (TYLENOL) tablet 650 mg  650 mg Oral Q6H PRN Dahlia Byes C, NP   650 mg at 09/01/22 1203   alum & mag hydroxide-simeth (MAALOX/MYLANTA) 200-200-20 MG/5ML suspension 30 mL  30 mL Oral Q4H PRN Earney Navy, NP       [START ON 09/02/2022] escitalopram (LEXAPRO) tablet 15 mg  15 mg Oral Daily Paco Cislo, MD       folic acid (FOLVITE) tablet 1 mg  1 mg Oral Daily Dahlia Byes C, NP   1 mg at 09/01/22 0802   hydroxyurea (HYDREA) capsule 1,000 mg  15 mg/kg Oral Daily Park Pope, MD   1,000 mg at 09/01/22 0802   magnesium hydroxide (MILK OF MAGNESIA) suspension 30 mL  30 mL Oral Daily PRN Earney Navy, NP       oxyCODONE-acetaminophen (PERCOCET/ROXICET) 5-325 MG per tablet 1 tablet  1 tablet Oral Q6H PRN Earney Navy, NP   1 tablet at 09/01/22 4782   traZODone (DESYREL) tablet 50 mg  50 mg Oral QHS PRN Onuoha, Chinwendu V, NP   50 mg at 08/30/22 2343    Lab Results:  Results for orders placed or  performed during the hospital encounter of 08/29/22 (from the past 48 hour(s))  Urinalysis, Routine w reflex microscopic Urine, Clean Catch     Status: Abnormal   Collection Time: 08/30/22  2:49 PM  Result Value Ref Range   Color, Urine YELLOW YELLOW   APPearance CLEAR CLEAR   Specific Gravity, Urine 1.013 1.005 - 1.030   pH 6.0 5.0 - 8.0   Glucose, UA NEGATIVE NEGATIVE mg/dL   Hgb urine dipstick SMALL (A) NEGATIVE   Bilirubin Urine NEGATIVE NEGATIVE   Ketones, ur NEGATIVE NEGATIVE mg/dL   Protein, ur >=956 (A) NEGATIVE mg/dL   Nitrite NEGATIVE NEGATIVE   Leukocytes,Ua  NEGATIVE NEGATIVE   RBC / HPF 0-5 0 - 5 RBC/hpf   WBC, UA 0-5 0 - 5 WBC/hpf   Bacteria, UA NONE SEEN NONE SEEN   Mucus PRESENT    Hyaline Casts, UA PRESENT     Comment: Performed at [facility], [address], [place], [place] [address]  CBC     Status: Abnormal   Collection Time: 08/31/22 11:09 AM  Result Value Ref Range   WBC 16.2 (H) 4.0 - 10.5 K/uL   RBC 2.80 (L) 4.22 - 5.81 MIL/uL   Hemoglobin 8.0 (L) 13.0 - 17.0 g/dL   HCT 20.2 (L) 54.2 - 70.6 %   MCV 81.8 80.0 - 100.0 fL   MCH 28.6 26.0 - 34.0 pg   MCHC 34.9 30.0 - 36.0 g/dL   RDW 23.7 (H) 62.8 - 31.5 %   Platelets 441 (H) 150 - 400 K/uL   nRBC 1.1 (H) 0.0 - 0.2 %    Comment: Performed at [facility], [address] [place], [place] [address]  Reticulocytes     Status: Abnormal   Collection Time: 08/31/22 11:09 AM  Result Value Ref Range   Retic Ct Pct 11.9 (H) 0.4 - 3.1 %    Comment: RESULTS CONFIRMED BY MANUAL DILUTION   RBC. 2.79 (L) 4.22 - 5.81 MIL/uL   Retic Count, Absolute 332.5 (H) 19.0 - 186.0 K/uL   Immature Retic Fract 29.1 (H) 2.3 - 15.9 %    Comment: Performed at [facility], [address] [place], [place] [address]  Iron and TIBC     Status: Abnormal   Collection Time: 08/31/22 11:09 AM  Result Value Ref Range   Iron 80 45 - 182 ug/dL   TIBC 062 (L) 694 - 854 ug/dL    Saturation Ratios 36 17.9 - 39.5 %   UIBC 140 ug/dL    Comment: Performed at [facility], [address] [place], [place] [address]  Ferritin     Status: None   Collection Time: 08/31/22 11:09 AM  Result Value Ref Range   Ferritin 330 24 - 336 ng/mL    Comment: Performed at [facility], [address] [place], [place] [address]  Hepatic function panel     Status: Abnormal   Collection Time: 08/31/22 11:09 AM  Result Value Ref Range   Total Protein 7.5 6.5 - 8.1 g/dL   Albumin 3.4 (L) 3.5 - 5.0 g/dL   AST 27 15 - 41 U/L   ALT 17 0 - 44 U/L   Alkaline Phosphatase 53 38 - 126 U/L   Total Bilirubin 3.8 (H) 0.3 - 1.2 mg/dL   Bilirubin, Direct 0.4 (H) 0.0 - 0.2 mg/dL   Indirect Bilirubin 3.4 (H) 0.3 - 0.9 mg/dL    Comment: Performed at [facility], [address], [place], [place] [address]    Blood Alcohol level:  Lab Results  Component Value Date   ETH <10 08/29/2022    Metabolic Disorder Labs: No results found for: "HGBA1C", "MPG" No results found for: "PROLACTIN" No results found for: "CHOL", "TRIG", "HDL", "CHOLHDL", "VLDL", "LDLCALC"  Physical Findings: AIMS:  , ,  ,  ,    CIWA:    COWS:     Musculoskeletal: Strength & Muscle Tone: within normal limits Gait & Station: normal Patient leans: N/A  Psychiatric Specialty Exam:  Presentation  General Appearance:  Casual (sitting up in bed)  Eye Contact: Fair  Speech: Clear and Coherent (slower rate)  Speech Volume: Decreased  Handedness: Right   Mood and Affect  Mood: Anxious; Depressed  Affect: Flat   Thought Process  Thought Processes: Coherent; Linear  Descriptions of Associations:Intact  Orientation:Full (Time, Place and Person)  Thought Content:Logical  History of Schizophrenia/Schizoaffective disorder:No  Duration of Psychotic Symptoms:No data recorded Hallucinations:Hallucinations: None  Ideas of  Reference:None  Suicidal Thoughts:Suicidal Thoughts: Yes, Passive SI Passive Intent and/or Plan: Without Intent; Without Plan  Homicidal Thoughts:Homicidal Thoughts: Yes, Passive (towards mother's ex bf, - does not know where this person is located or even if they are in the state of [place]. passive. no intent. no plan.) HI Passive Intent and/or Plan: Without Intent; Without Plan   Sensorium  Memory: Recent Good; Remote Good; Immediate Good  Judgment: Intact  Insight: Fair   Chartered certified accountantxecutive Functions  Concentration: Fair  Attention Span: Fair  Recall: Jennelle HumanFair  Fund of Knowledge: Fair  Language: Fair   Psychomotor Activity  Psychomotor Activity: Psychomotor Activity: Normal   Assets  Assets: Communication Skills   Sleep  Sleep: Sleep: Fair    Physical Exam: Physical Exam Vitals reviewed.  Pulmonary:     Effort: Pulmonary effort is normal.  Neurological:     Motor: No weakness.     Gait: Gait normal.    Review of Systems  Neurological:  Negative for dizziness, tingling, tremors and headaches.  Psychiatric/Behavioral:  Positive for depression and suicidal ideas. Negative for hallucinations. The patient is nervous/anxious.    Blood pressure 130/79, pulse 88, temperature 99.6 F (37.6 C), temperature source Oral, resp. rate 16, height 5\' 9"  (1.753 m), weight 68.9 kg, SpO2 93 %. Body mass index is 22.45 kg/m.   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  This is hospitalization day 2.   Patient reports mild improvement with Lexapro initiation.   Continues to be passively suicidal but is able to contract for safety on the unit.  Plan for increasing Lexapro.  He reports good effect from trazodone when he had problems sleeping last night.  Will continue trazodone.  Has not required any as needed medications for psychiatric problems.  Required 3 Percocet dosages over past 24 hours due to sickle cell pain.   Psychiatric diagnosis list: Major Depressive Disorder, Recurrent episode, severe, without psychotic features Generalized anxiety disorder Cannabis use disorder   PLAN Safety and Monitoring: Voluntary admission to inpatient psychiatric unit for safety, stabilization and treatment Daily contact with patient to assess and evaluate symptoms and progress in treatment Patient's case to be discussed in multi-disciplinary team meeting Observation Level : q15 minute checks Vital signs: q12 hours Precautions: suicide, elopement, and assault   Major Depressive Disorder, Recurrent episode, severe, without psychotic features Generalized anxiety disorder Cannabis use disorder --INCREASE Lexapro to 15 mg for depressive symptoms and anxiety --Encourage cannabis cessation -- The risks/benefits/side-effects/alternatives to this medication were discussed in detail with the patient and time was given for questions. The patient consents to medication trial.  -- Encouraged patient to participate in unit milieu and in scheduled group therapies    3. Medical Management Sickle  Cell Disease Thrombocytosis Leucocytosis Anemia #Previously poke with Massie Maroon, FNP regarding recommendations and she thought it would be reasonable to restart hydroxyurea. He will need close follow up to ensure he can get patient assistance.  # pt has pcp appt on 12-12 -23 scheduled   Baseline Hgb between 7-8. WBC elevated and CBC -Folvite 1 mg daily  -Hydroxyurea 1000 mg daily to reduce frequency of sickle cell crisis -Repeat CBC and hepatic function panel to  ensure no worsening signs leading to sickle cell crisis   PRN The following PRN medications were added to ensure patient can focus on treatment. These were discussed with patient and patient aware of ability to ask for the following medications:  -Tylenol 650 mg q6hr PRN for mild pain -Mylanta 30 ml suspension for indigestion -Milk of Magnesia 30 ml for constipation -Trazodone 50 mg qhs for insomnia -Hydroxyzine 25 mg tid PRN for anxiety   4. Discharge Planning Patient will require the following based on my assessment:  Greatly appreciate CSW and Case management assistance with facilitating these needs and any further recommendations regarding patient's needs upon discharge. Estimated LOS: 5-7 days Discharge Concerns: Need to establish a safety plan; Medication compliance and effectiveness Discharge Goals: Return home with outpatient referrals for mental health follow-up including medication management/psychotherapy      Cristy Hilts, MD 09/01/2022, 12:42 PM  Total Time Spent in Direct Patient Care:  I personally spent 35 minutes on the unit in direct patient care. The direct patient care time included face-to-face time with the patient, reviewing the patient's chart, communicating with other professionals, and coordinating care. Greater than 50% of this time was spent in counseling or coordinating care with the patient regarding goals of hospitalization, psycho-education, and discharge planning needs.   Phineas Inches, MD Psychiatrist

## 2022-09-01 NOTE — Progress Notes (Signed)
Adult Psychoeducational Group Note  Date:  09/01/2022 Time:  11:28 AM  Group Topic/Focus:  Orientation:   The focus of this group is to educate the patient on the purpose and policies of crisis stabilization and provide a format to answer questions about their admission.  The group details unit policies and expectations of patients while admitted.  Participation Level:  Active  Participation Quality:  Appropriate  Affect:  Appropriate  Cognitive:  Appropriate  Insight: Appropriate  Engagement in Group:  Engaged  Modes of Intervention:  Discussion  Additional Comments:  Patient attended morning orientation/goals group and participated.   Kaniah Rizzolo W Darrly Loberg 09/01/2022, 11:28 AM

## 2022-09-01 NOTE — Progress Notes (Signed)
[facility] LCSW Progress Note   09/01/2022    3:05 PM  Elzie Andrez Grime      Type of Note: [employer]   CSW provided patient with a [employer] of homeless shelters, boarding housing, housing authorities, food banks, and clothing. CSW informed resident about packet and he said thank you and to leave packet in his room.     Signed:   Jacob Moores, MSW, [facility] 09/01/2022 3:05 PM

## 2022-09-01 NOTE — Progress Notes (Signed)
Pt medication compliant. Pt received increased dose of lexapro this morning. Pt presents with flat affect, denies suicidal thoughts. Pt reports difficulty falling asleep but states he at home doesn't sleep until after midnight per his schedule. Pt endorses controlled pain related to sickle cell 3/10 with percocet this morning. Q 15 minute checks ongoing for safety.

## 2022-09-01 NOTE — Progress Notes (Signed)
Patient did not attend afternoon MHT 5 Languages Discussion group.  

## 2022-09-02 DIAGNOSIS — F322 Major depressive disorder, single episode, severe without psychotic features: Secondary | ICD-10-CM | POA: Diagnosis not present

## 2022-09-02 NOTE — Progress Notes (Addendum)
Pt came to nursing station and c/o persistent suicidal thoughts. Pt states, "I don't think the medicine they're giving me is workingAir cabin crew asked Pt why he doesn't believe it's working. Pt states, "Because I'm still having suicidal thoughts." Pt did not respond when Writer asked if he has a plan. Writer informed Pt she will notify his doctor. Pt verbalized agreement. Writer notified the attending physician regarding Pt's concern. Writer asked Pt if he is more comfortable sitting near the nursing station, Pt states "Yes." Pt sat in cubby near nursing station to remain under Writer's surveillance until he speaks with his provider. Pt placed on 1:1 by physician until tomorrow, 09/03/2022, when he is seen by provider.

## 2022-09-02 NOTE — Progress Notes (Signed)
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     Yesterday the psych team made the following recommendations: --INCREASE Lexapro to 15 mg for depressive symptoms and anxiety    On Evaluation Today: Patient was seen at the bedside by Clinical research associate.  Patient continues to report the Lexapro is helping him with his mood and energy and denies adverse effects of medication. Reports that sleep and concentration are better, and appetite is okay. Reports anxiety is moderate (less severe than depression but bothersome).  Patient has been interacting with the other patients on the unit.  He denies active and passive SI as well as HI towards mother's ex bf that was previously reported. Denies psychotic symptoms.  He reports the medication that he is taking for the diagnosis of Sickle Cell is continuing to reduce his pain, only requiring 2 doses over 24 hour period.   Principal Problem: Major depressive disorder, single episode, severe (HCC) Diagnosis: Principal Problem:   Major depressive disorder, single episode, severe (HCC) Active Problems:   Sickle cell anemia (HCC)   Hyperbilirubinemia  Total Time spent with patient: 15 minutes  Past Psychiatric History:  Previous Psych Diagnoses: none Prior inpatient treatment: denies Current/prior outpatient treatment:denies Psychotherapy XT:KWIO History of suicide:none, chronic SI for several years History of homicide: none Psychiatric medication history:none Psychiatric medication compliance history:n/a Neuromodulation history: none Current Psychiatrist:none Current therapist:  none    Past Medical History:  Past Medical History:  Diagnosis Date   Acute chest syndrome (HCC)    Seasonal allergies    Sickle cell anemia (HCC)     Past Surgical History:  Procedure Laterality Date   adnoidectomy     CHOLECYSTECTOMY     TONSILLECTOMY AND ADENOIDECTOMY     Family History:  Family History  Problem Relation Age of Onset   Hypertension Other    Diabetes Other    Cancer Other    Stroke Other    Sickle cell anemia Brother    Sickle cell anemia Maternal Grandfather    Family Psychiatric  History: brother with likely depression SA/HA: brother attempted suicide ~5 years ago    Social History:  Social History   Substance and Sexual Activity  Alcohol Use No     Social History   Substance and Sexual Activity  Drug Use No    Social History   Socioeconomic History   Marital status: Single    Spouse name: Not on file   Number of children: Not on file   Years of education: Not on file   Highest education level: Not on file  Occupational History   Not on file  Tobacco Use   Smoking status: Never   Smokeless tobacco: Never  Vaping Use   Vaping Use: Not on file  Substance and Sexual Activity   Alcohol use: No   Drug use: No   Sexual activity: Yes  Other Topics Concern   Not on file  Social History Narrative   Not on file   Social Determinants of Health   Financial Resource Strain: Not on file  Food Insecurity: No Food Insecurity (08/30/2022)  Hunger Vital Sign    Worried About Running Out of Food in the Last Year: Never true    Ran Out of Food in the Last Year: Never true  Transportation Needs: No Transportation Needs (08/30/2022)   PRAPARE - Administrator, Civil Service (Medical): No    Lack of Transportation (Non-Medical): No  Physical Activity: Not on file  Stress: Not on file  Social Connections: Not on file   Additional Social History:                         Sleep: Fair  Appetite:  Fair  Current  Medications: Current Facility-Administered Medications  Medication Dose Route Frequency Provider Last Rate Last Admin   acetaminophen (TYLENOL) tablet 650 mg  650 mg Oral Q6H PRN Dahlia Byes C, NP   650 mg at 09/02/22 0636   alum & mag hydroxide-simeth (MAALOX/MYLANTA) 200-200-20 MG/5ML suspension 30 mL  30 mL Oral Q4H PRN Dahlia Byes C, NP       escitalopram (LEXAPRO) tablet 15 mg  15 mg Oral Daily Massengill, Nathan, MD   15 mg at 09/02/22 0801   folic acid (FOLVITE) tablet 1 mg  1 mg Oral Daily Onuoha, Josephine C, NP   1 mg at 09/02/22 0801   hydroxyurea (HYDREA) capsule 1,000 mg  15 mg/kg Oral Daily Park Pope, MD   1,000 mg at 09/02/22 0801   magnesium hydroxide (MILK OF MAGNESIA) suspension 30 mL  30 mL Oral Daily PRN Dahlia Byes C, NP       oxyCODONE-acetaminophen (PERCOCET/ROXICET) 5-325 MG per tablet 1 tablet  1 tablet Oral Q6H PRN Dahlia Byes C, NP   1 tablet at 09/01/22 1807   traZODone (DESYREL) tablet 50 mg  50 mg Oral QHS PRN Onuoha, Chinwendu V, NP   50 mg at 09/01/22 2205    Lab Results:  No results found for this or any previous visit (from the past 48 hour(s)).   Blood Alcohol level:  Lab Results  Component Value Date   ETH <10 08/29/2022    Metabolic Disorder Labs: No results found for: "HGBA1C", "MPG" No results found for: "PROLACTIN" No results found for: "CHOL", "TRIG", "HDL", "CHOLHDL", "VLDL", "LDLCALC"  Physical Findings: AIMS:  , ,  ,  ,    CIWA:    COWS:     Musculoskeletal: Strength & Muscle Tone: within normal limits Gait & Station: normal Patient leans: N/A  Psychiatric Specialty Exam:  Presentation  General Appearance:  Appropriate for Environment; Casual  Eye Contact: Fair  Speech: Clear and Coherent  Speech Volume: Normal  Handedness: Right   Mood and Affect  Mood: -- (Less depressed)  Affect: Blunt; Congruent   Thought Process  Thought Processes: Coherent; Linear  Descriptions of  Associations:Intact  Orientation:Full (Time, Place and Person)  Thought Content:Logical  History of Schizophrenia/Schizoaffective disorder:No  Duration of Psychotic Symptoms:No data recorded Hallucinations:Hallucinations: None  Ideas of Reference:None  Suicidal Thoughts:Suicidal Thoughts: No SI Passive Intent and/or Plan: Without Intent; Without Plan  Homicidal Thoughts:Homicidal Thoughts: No HI Passive Intent and/or Plan: Without Intent; Without Plan   Sensorium  Memory: Recent Good; Immediate Good  Judgment: Intact  Insight: Fair   Chartered certified accountant: Fair  Attention Span: Fair  Recall: Jennelle Human of Knowledge: Fair  Language: Fair   Psychomotor Activity  Psychomotor Activity: Psychomotor Activity: Normal   Assets  Assets: Manufacturing systems engineer; Desire for Improvement   Sleep  Sleep: Sleep: Fair  Physical Exam: Physical Exam Vitals reviewed.  Pulmonary:     Effort: Pulmonary effort is normal.  Neurological:     Motor: No weakness.     Gait: Gait normal.    Review of Systems  Neurological:  Negative for dizziness, tingling, tremors and headaches.  Psychiatric/Behavioral:  Positive for depression and suicidal ideas. Negative for hallucinations. The patient is nervous/anxious.    Blood pressure (!) 122/90, pulse 100, temperature 98.5 F (36.9 C), resp. rate 16, height 5\' 9"  (1.753 m), weight 68.9 kg, SpO2 92 %. Body mass index is 22.45 kg/m.   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  This is  hospitalization day 4.   Patient reports improvement with Lexapro initiation.  Denies SI and is able to contract for safety on the unit.  Will plan to keep Lexapro at current dose.  He reports good effect from trazodone when he had problems sleeping last night.  Will continue trazodone.  Has not required any as needed medications for psychiatric problems.  Required 2 Percocet dosages over past 24 hours due to sickle cell pain.   Psychiatric diagnosis list: Major Depressive Disorder, Recurrent episode, severe, without psychotic features Generalized anxiety disorder Cannabis use disorder   PLAN Safety and Monitoring: Voluntary admission to inpatient psychiatric unit for safety, stabilization and treatment Daily contact with patient to assess and evaluate symptoms and progress in treatment Patient's case to be discussed in multi-disciplinary team meeting Observation Level : q15 minute checks Vital signs: q12 hours Precautions: suicide, elopement, and assault   Major Depressive Disorder, Recurrent episode, severe, without psychotic features Generalized anxiety disorder Cannabis use disorder --Continue Lexapro to 15 mg for depressive symptoms and anxiety --Encourage cannabis cessation -- The risks/benefits/side-effects/alternatives to this medication were discussed in detail with the patient and time was given for questions. The patient consents to medication trial.  -- Encouraged patient to participate in unit milieu and in scheduled group therapies    3. Medical Management Sickle Cell Disease Thrombocytosis Leucocytosis Anemia #Provider previously spoke with 30, FNP regarding recommendations and she thought it would be reasonable to restart hydroxyurea. He will need close follow up to ensure he can get patient assistance.  # pt has pcp appt on 12-12 -23 scheduled   Baseline Hgb between 7-8. WBC elevated and CBC -Folvite 1 mg daily  -Hydroxyurea 1000 mg daily to reduce  frequency of sickle cell crisis -Repeat CBC and hepatic function panel to ensure no worsening signs leading to sickle cell crisis   PRN The following PRN medications were added to ensure patient can focus on treatment. These were discussed with patient and patient aware of ability to ask for the following medications:  -Tylenol 650 mg q6hr PRN for mild pain -Mylanta 30 ml suspension for indigestion -Milk of Magnesia 30 ml for constipation -Trazodone 50 mg qhs for insomnia -Hydroxyzine 25 mg tid PRN for anxiety   4. Discharge Planning Patient will require the following based on my assessment:  Greatly appreciate CSW and Case management assistance with facilitating these needs and any further recommendations regarding patient's needs upon  discharge. Estimated Date of Discharge: 12/4 Discharge Concerns: Need to establish a safety plan; Medication compliance and effectiveness Discharge Goals: Return home with outpatient referrals for mental health follow-up including medication management/psychotherapy    Lamar Sprinklesourtney Shameer Molstad, MD 09/02/2022, 1:57 PM

## 2022-09-02 NOTE — Progress Notes (Signed)
Adult Psychoeducational Group Note  Date:  09/02/2022 Time:  11:14 AM  Group Topic/Focus:  Orientation:   The focus of this group is to educate the patient on the purpose and policies of crisis stabilization and provide a format to answer questions about their admission.  The group details unit policies and expectations of patients while admitted.  Participation Level:  Active  Participation Quality:  Appropriate  Affect:  Appropriate  Cognitive:  Appropriate  Insight: Appropriate  Engagement in Group:  Engaged  Modes of Intervention:  Discussion  Additional Comments:  Patient attended morning orientation group and participated.   Estellar Cadena W Alexzander Dolinger 09/02/2022, 11:14 AM

## 2022-09-02 NOTE — Progress Notes (Signed)
   09/02/22 0800  Sleep  Number of Hours 5.75

## 2022-09-02 NOTE — [facility] Group Notes (Signed)
.  Psychoeducational Group Note    Date:  09/02/2022 Time:1300-1400    Purpose of Group: . The group focus' on teaching patients on how to identify their needs and their Life Skills:  A group where two lists are made. What people need and what are things that we do that are unhealthy. The lists are developed by the patients and it is explained that we often do the actions that are not healthy to get our list of needs met.  Goal:: to develop the coping skills needed to get their needs met  Participation Level: did not attend Additional Comments:    Paulino Rily

## 2022-09-02 NOTE — Group Note (Signed)
LCSW Group Therapy Note   No social work group was held; the following was provided to each patient  in lieu of in-person group:  Healthy vs. Unhealthy  Coping Skills and Supports   Unhealthy Qualities                                             Healthy Qualities Works (at first) Works   Stops working or starts hurting Continues working  Fast Usually takes time to develop  Easy Often difficult to learn  Usually a habit Usually unknown, has to become a habit  Can do alone Often need to reach out for help   Leads to loss Leads to gain         My Unhealthy Coping Skills                                    My Healthy Coping Skills                       My Unhealthy Supports                                           My Healthy Supports                         Jailah Willis Grossman-Orr, LCSW 09/02/2022  5:45 PM     

## 2022-09-02 NOTE — Progress Notes (Addendum)
   09/01/22 2155  Psych Admission Type (Psych Patients Only)  Admission Status Voluntary  Psychosocial Assessment  Patient Complaints Anxiety;Self-harm thoughts;Other (Comment) (Patient reports he is fidgety.)  Eye Contact Fair  Facial Expression Flat  Affect Anxious;Depressed  Speech Soft;Logical/coherent  Interaction Assertive  Motor Activity Fidgety  Appearance/Hygiene Unremarkable  Behavior Characteristics Cooperative  Mood Anxious;Depressed  Thought Process  Coherency WDL  Content WDL  Delusions None reported or observed  Perception WDL  Hallucination None reported or observed  Judgment Impaired  Confusion None  Danger to Self  Current suicidal ideation? Denies  Description of Suicide Plan No plan.  Agreement Not to Harm Self Yes  Description of Agreement Verbally contracts for safety.  Danger to Others  Danger to Others None reported or observed   Patient alert and oriented. Presenting with an anxious, depressed affect and mood. Patient denies HI, AVH, and pain. Patient rates anxiety 3/10, with 0 being the least and 10 being the worst and denies depression at this time. Patient is passive for SI with no plan/intent at this time. Patient is able to verbally contract for safety and agrees to inform a member of staff before acting on any thoughts of SI/self-harm. PRN trazodone administered to patient, per provider orders. Patient self-reported fall 12/01 stating that he fell in the gym during basketball. Patient denies hitting his head and states that he hit his leg. Patient denies pain at this time. Reported to [facility] NP, monitor for pain per provider. Support and encouragement provided. Routine safety checks conducted every 15 minutes. Patient informed to notify staff with problems or concerns. Patient interacts well with others and remains safe on the unit.

## 2022-09-02 NOTE — Progress Notes (Signed)
Pt is A&OX4, calm, continues to admit to passive suicidal ideations, denies homicidal ideations, denies auditory hallucinations and denies visual hallucinations. Pt verbally agrees to approach staff if these become apparent and before harming self or others. Pt denies experiencing nightmares. Mood and affect are congruent. Pt appetite is ok. No complaints of anxiety, distress, pain and/or discomfort at this time. Pt's memory appears to be grossly intact, and Pt hasn't displayed any injurious behaviors. Pt is medication compliant. There's no evidence of suicidal intent. Psychomotor activity was WNL. No s/s of Parkinson, Dystonia, Akathisia and/or Tardive Dyskinesia noted.

## 2022-09-02 NOTE — Group Note (Deleted)
LCSW Group Therapy Note  Group Date: 09/02/2022 Start Time: 1000 End Time: 1100   Type of Therapy and Topic:  Group Therapy: Using "I" Statements  Participation Level:  {[facility] PARTICIPATION LEVEL:22264}  Description of Group:  Patients were asked to provide details of some interpersonal conflicts they have experienced. Patients were then educated about "I" statements, communication which focuses on feelings or views of the speaker rather than what the other person is doing. T group members were asked to reflect on past conflicts and to provide specific examples for utilizing "I" statements.  Therapeutic Goals:  1. Patients will verbalize understanding of ineffective communication and effective communication. 2. Patients will be able to empathize with whom they are having conflict. 3. Patients will practice effective communication in the form of "I" statements.    Summary of Patient Progress:  *** shared ***. The patient was ***present/active throughout the session and proved open to feedback from CSW and peers. Patient demonstrated *** insight into the subject matter, was respectful of peers, and was present throughout the entire session.  Therapeutic Modalities:   Cognitive Behavioral Therapy Solution-Focused Therapy    Oda Lansdowne J Grossman-Orr, LCSW 09/02/2022  9:42 AM    

## 2022-09-02 NOTE — Progress Notes (Signed)
   09/02/22 2200  Psych Admission Type (Psych Patients Only)  Admission Status Voluntary  Psychosocial Assessment  Patient Complaints Anxiety;Self-harm thoughts  Eye Contact Fair  Facial Expression Flat  Affect Anxious;Depressed  Speech Soft;Logical/coherent  Interaction Assertive  Motor Activity Fidgety  Appearance/Hygiene Unremarkable  Behavior Characteristics Cooperative  Mood Anxious;Depressed  Thought Process  Coherency WDL  Content WDL  Delusions None reported or observed  Perception WDL  Hallucination None reported or observed  Judgment Impaired  Confusion None  Danger to Self  Current suicidal ideation? Denies  Agreement Not to Harm Self Yes  Description of Agreement verbally contracts for safety  Danger to Others  Danger to Others None reported or observed

## 2022-09-02 NOTE — Progress Notes (Signed)
Adult Psychoeducational Group Note  Date:  09/02/2022 Time:  9:30 PM  Group Topic/Focus:  Wrap-Up Group:   The focus of this group is to help patients review their daily goal of treatment and discuss progress on daily workbooks.  Participation Level:  Active  Participation Quality:  Appropriate  Affect:  Appropriate  Cognitive:  Appropriate  Insight: Appropriate  Engagement in Group:  Engaged  Modes of Intervention:  Education and Exploration  Additional Comments:  Patient attended and participated in group tonight. He reports that his day was a 5 because he has been over thinking. 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    Lita Mains [facility] 09/02/2022, 9:30 PM

## 2022-09-02 NOTE — [facility] Group Notes (Signed)
Psychoeducational Group Note  Date: 09/02/22 Time: 0900-1000    Goal Setting   Purpose of Group: This group helps to provide patients with the steps of setting a goal that is specific, measurable, attainable, realistic and time specific. A discussion on how we keep ourselves stuck with negative self talk. Homework given for Patients to write 30 positive attributes about themselves.    Participation Level:  Active  Participation Quality:  Appropriate  Affect:  Appropriate  Cognitive:  Appropriate  Insight:  Improving  Engagement in Group:  Engaged  Additional Comments: Rates energy at a 6/10. Participated fully in the group.  Dione Housekeeper

## 2022-09-03 DIAGNOSIS — F322 Major depressive disorder, single episode, severe without psychotic features: Secondary | ICD-10-CM

## 2022-09-03 MED ORDER — HYDROXYZINE HCL 25 MG PO TABS
25.0000 mg | ORAL_TABLET | Freq: Three times a day (TID) | ORAL | Status: DC | PRN
  Administered 2022-09-03 – 2022-09-04 (×2): 25 mg via ORAL
  Filled 2022-09-03 (×2): qty 1

## 2022-09-03 MED ORDER — ESCITALOPRAM OXALATE 20 MG PO TABS
20.0000 mg | ORAL_TABLET | Freq: Every day | ORAL | Status: DC
  Administered 2022-09-04 – 2022-09-07 (×4): 20 mg via ORAL
  Filled 2022-09-03: qty 10
  Filled 2022-09-03: qty 1
  Filled 2022-09-03: qty 10
  Filled 2022-09-03 (×5): qty 1

## 2022-09-03 MED ORDER — TRAZODONE HCL 50 MG PO TABS
25.0000 mg | ORAL_TABLET | Freq: Every evening | ORAL | Status: DC | PRN
  Administered 2022-09-04 – 2022-09-05 (×2): 25 mg via ORAL
  Filled 2022-09-03 (×2): qty 1

## 2022-09-03 MED ORDER — TRAZODONE HCL 50 MG PO TABS
50.0000 mg | ORAL_TABLET | Freq: Once | ORAL | Status: AC | PRN
  Administered 2022-09-03: 50 mg via ORAL
  Filled 2022-09-03: qty 1

## 2022-09-03 NOTE — Progress Notes (Addendum)
1:1 Note 1500  Patient has been laying in bed resting this afternoon.  No complaint of pain, just felt tired.  Respirations even and unlabored, no signs/symptoms of pain/distress noted on patient's face/body movements.  Patient has denied SI and HI, contracts for safety.  Denied A/V hallucinations.  1:1 continues for safety per MD orders.

## 2022-09-03 NOTE — Progress Notes (Signed)
1:1 Note 1625  Patient standing at medication window.  Stated he feels rested now.  Respirations even and unlabored.  No signs/symptoms of distress noted on patient's face/body movements.    Patient has been out of bed, sitting with peers, talking.  1:1 continues per MD orders.

## 2022-09-03 NOTE — Progress Notes (Signed)
1:1 Note 0750  Patient resting comfortably in his bed with eyes closed.  No signs/symptoms of pain/distress noted on patient's face or body movements.  1:1 continues for patient's safety.

## 2022-09-03 NOTE — Plan of Care (Signed)
Nurse discussed coping skills with patient.  

## 2022-09-03 NOTE — Progress Notes (Signed)
Pt did not attend wrap-up group   

## 2022-09-03 NOTE — Progress Notes (Signed)
1:1 Note 1300  Patient has been resting in bed after lunch.  Respirations even and unlabored.  No signs/symptoms of pain/distress noted on patient's face/body movements.  1:1 continues for patient's safety per MD orders.

## 2022-09-03 NOTE — Progress Notes (Signed)
1:1 Note   Pt calm and cooperative, watching television in the dayroom. Patient has been positively interacting with peers and staff. Discussed with patient safety guidelines of the facility and her verbalized understanding and verbally contracted for safety. Pt currently has a 1:1 sitter. Pt appears to be an appropriate candidate to trail off of 1:1 observation. Roselyn Bering, NP made aware and discontinuation of 1:1 observation discussed.

## 2022-09-03 NOTE — Progress Notes (Addendum)
1:1 Note 1100   Patient rested in bed with eyes closed this morning.  Then he sat up in bed and ate his breakfast and took his morning medicines.  Patient attended group this morning.  Patient has been active in milieu.  Respirations even and unlabored.  No signs/symptoms of pain/distress noted on patient's face/body movements.   Patient denied SI and HI, contracts for safety.  Denied A/V hallucinations.  Stated he slept good last night between 6-8 hours.  Marland Kitchen

## 2022-09-03 NOTE — Progress Notes (Signed)
Patient remains 1:1 for safety. Pt is resting quietly in bed with eyes closed, Respirations equal and unlabored, skin warm and dry, NAD. Routine safety checks conducted according to facility protocol. Will continue to monitor for safety.

## 2022-09-03 NOTE — Progress Notes (Signed)
1:1 Note 1900  Patient  has been in dayroom after dinner, taking to peers, watching tv.  1:1 present for safety per MD orders.  Respirations even and unlabored.  No signs/symptoms of pain/distress noted on patient's face/body movements.  Patient has been relaxed, talking, smiling.  Will continue to monitor patient per MD order for 1:1.

## 2022-09-03 NOTE — Progress Notes (Addendum)
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     Principal Problem: Major depressive disorder, single episode, severe (HCC) Diagnosis: Principal Problem:   Major depressive disorder, single episode, severe (HCC) Active Problems:   Sickle cell anemia (HCC)   Hyperbilirubinemia   Chart Review of Past 24 Hours: Per MAR, patient has been compliant with all scheduled medications. Patient has been attending groups appropriately and has not been a behavioral concern. Patient required the following behavioral PRNs: tylenol, oxycodone, and trazadone  Yesterday's Recommendations per Psychiatry Team: Continue lexapro 15 mg  On Evaluation Today:  Patient was seen at bedside.  On assessment today, patient reports feeling "better". He reports his mood is improving although reporting passive SI yesterday.  He reports that the passive SI occurs when he stays in his room and ruminates on thoughts.  He agrees that today's goal will be distracting himself more by being in the day room.  Reports having poor sleep and fair appetite.  He does not elaborate on why he was unable to sleep overnight.  He denies HI and AVH.  He reports improving pain from his sickle cell, only needing 1 dose of oxycodone over 24 hours.  Total Time spent with patient: 20 minutes  Past Psychiatric History:  Previous Psych Diagnoses: none Prior inpatient treatment: denies Current/prior outpatient treatment:denies Psychotherapy [facility]:EN[facility] History of suicide:none, chronic SI for several years History of homicide: none Psychiatric medication history:none Psychiatric  medication compliance history:n/a Neuromodulation history: none Current Psychiatrist:none Current therapist: none  Past Medical History:  Past Medical History:  Diagnosis Date   Acute chest syndrome (HCC)    Seasonal allergies    Sickle cell anemia (HCC)     Past Surgical History:  Procedure Laterality Date   adnoidectomy     CHOLECYSTECTOMY     TONSILLECTOMY AND ADENOIDECTOMY     Family History:  Family History  Problem Relation Age of Onset   Hypertension Other    Diabetes Other    Cancer Other    Stroke Other    Sickle cell anemia Brother    Sickle cell anemia Maternal Grandfather    Family Psychiatric  History: brother with likely depression SA/HA: brother attempted suicide ~5 years ago  Social History:  Social History   Substance and Sexual Activity  Alcohol Use No     Social History   Substance and Sexual Activity  Drug Use No    Social History   Socioeconomic History   Marital status: Single    Spouse name: Not on file   Number of children: Not on file   Years of education: Not on file   Highest education level: Not on file  Occupational History   Not on file  Tobacco Use   Smoking status: Never   Smokeless tobacco: Never  Vaping Use   Vaping Use: Not on file  Substance and Sexual Activity   Alcohol use: No   Drug use: No   Sexual activity: Yes  Other Topics Concern   Not on file  Social History Narrative   Not on file   Social Determinants of Health  Financial Resource Strain: Not on file  Food Insecurity: No Food Insecurity (08/30/2022)   Hunger Vital Sign    Worried About Running Out of Food in the Last Year: Never true    Ran Out of Food in the Last Year: Never true  Transportation Needs: No Transportation Needs (08/30/2022)   PRAPARE - Hydrologist (Medical): No    Lack of Transportation (Non-Medical): No  Physical Activity: Not on file  Stress: Not on file  Social Connections: Not on file     Sleep: Poor  Appetite:  Fair  Current Medications: Current Facility-Administered Medications  Medication Dose Route Frequency Provider Last Rate Last Admin   acetaminophen (TYLENOL) tablet 650 mg  650 mg Oral Q6H PRN Charmaine Downs C, NP   650 mg at 09/02/22 0636   alum & mag hydroxide-simeth (MAALOX/MYLANTA) 200-200-20 MG/5ML suspension 30 mL  30 mL Oral Q4H PRN Charmaine Downs C, NP       escitalopram (LEXAPRO) tablet 15 mg  15 mg Oral Daily Massengill, Ovid Curd, MD   15 mg at [id number]   folic acid (FOLVITE) tablet 1 mg  1 mg Oral Daily Onuoha, Josephine C, NP   1 mg at 09/02/22 0801   hydroxyurea (HYDREA) capsule 1,000 mg  15 mg/kg Oral Daily France Ravens, MD   1,000 mg at 09/02/22 0801   magnesium hydroxide (MILK OF MAGNESIA) suspension 30 mL  30 mL Oral Daily PRN Charmaine Downs C, NP       oxyCODONE-acetaminophen (PERCOCET/ROXICET) 5-325 MG per tablet 1 tablet  1 tablet Oral Q6H PRN Charmaine Downs C, NP   1 tablet at 09/02/22 1903   traZODone (DESYREL) tablet 50 mg  50 mg Oral QHS PRN Onuoha, Chinwendu V, NP   50 mg at 09/02/22 2122    Lab Results:  No results found for this or any previous visit (from the past 92 hour(s)).  Blood Alcohol level:  Lab Results  Component Value Date   ETH <10 [id number]    Metabolic Disorder Labs: No results found for: "HGBA1C", "MPG" No results found for: "PROLACTIN" No results found for: "CHOL", "TRIG", "HDL", "CHOLHDL", "VLDL", "LDLCALC"  Musculoskeletal: Strength & Muscle Tone: within normal limits Gait & Station: normal Patient leans: N/A  Psychiatric Specialty Exam:  Presentation  General Appearance:  Appropriate for Environment   Eye Contact: Fair   Speech: Clear and Coherent   Speech Volume: Decreased   Handedness: Right    Mood and Affect  Mood: Depressed   Affect: Congruent    Thought Process  Thought Processes: Coherent   Descriptions of Associations:Intact   Orientation:Full  (Time, Place and Person)   Thought Content:Logical   History of Schizophrenia/Schizoaffective disorder:No   Duration of Psychotic Symptoms:No data recorded  Hallucinations:Hallucinations: None  Ideas of Reference:None   Suicidal Thoughts:Suicidal Thoughts: Yes, Passive SI Passive Intent and/or Plan: Without Intent; Without Plan  Homicidal Thoughts:Homicidal Thoughts: No   Sensorium  Memory: Immediate Good; Recent Good; Remote Good   Judgment: Fair   Insight: Lacking    Executive Functions  Concentration: Fair   Attention Span: Good   Recall: Good   Fund of Knowledge: Fair   Language: Good    Psychomotor Activity  Psychomotor Activity: Psychomotor Activity: Normal   Assets  Assets: Desire for Improvement; Communication Skills; Resilience    Sleep  Sleep: Sleep: Poor    Physical Exam:   Blood pressure 125/85, pulse (!) 105, temperature 98.5 F (36.9 C), resp. rate 16,  height 5\' 9"  (1.753 m), weight 68.9 kg, SpO2 93 %. Body mass index is 22.45 kg/m.   Treatment Plan Summary:  ASSESSMENT: William Gallegos is a 21 yo male w/ hx of sickle cell disease type SS c/b multiple admissions for sickle cell crisis, sickle cell anemia, acute chest syndrome and hyperbilirubinemia, homelessness, cannabis use disorder and no known PPHx presenting from [place] due to [place] with plan to shoot himself with gun he had confiscated by police.  This is hospitalization day 5.    Psychiatric diagnosis list: Major Depressive Disorder, Recurrent episode, severe, without psychotic features Generalized anxiety disorder Cannabis use disorder   PLAN: Safety and Monitoring:             -- (In)Voluntary admission to inpatient psychiatric unit for safety, stabilization and treatment             -- Daily contact with patient to assess and evaluate symptoms and progress in treatment             -- Patient's case to be discussed in multi-disciplinary team meeting              -- Observation Level : q15 minute checks             -- Vital signs:  q12 hours             -- Precautions: suicide, elopement, and assault   2. Psychiatric Diagnoses and Treatment:   Major Depressive Disorder, Recurrent episode, severe, without psychotic features Generalized anxiety disorder Cannabis use disorder --Increase Lexapro from 15 mg to 20 mg for fluctuating SI, depressive symptoms and anxiety --Encourage cannabis cessation -- The risks/benefits/side-effects/alternatives to this medication were discussed in detail with the patient and time was given for questions. The patient consents to medication trial.  -- Encouraged patient to participate in unit milieu and in scheduled group therapies    3. Medical Diagnoses and Treatment:  Sickle Cell Disease Thrombocytosis Leucocytosis Anemia Hbg on 11/30 was 8.0. Iron studies showed TIBC 220. --Provider previously spoke with Dorena Dew, FNP regarding recommendations and she thought it would be reasonable to restart hydroxyurea. He will need close follow up to ensure he can get patient assistance.  --pt has pcp appt on 12-12 -23 scheduled     Baseline Hgb between 7-8. WBC elevated and CBC -Folvite 1 mg daily  -Hydroxyurea 1000 mg daily to reduce frequency of sickle cell crisis -Repeat CBC and hepatic function panel to ensure no worsening signs leading to sickle cell crisis tomorrow morning   PRN The following PRN medications were added to ensure patient can focus on treatment. These were discussed with patient and patient aware of ability to ask for the following medications:  -Tylenol 650 mg q6hr PRN for mild pain -Mylanta 30 ml suspension for indigestion -Milk of Magnesia 30 ml for constipation -Trazodone 50 mg qhs for insomnia -Hydroxyzine 25 mg tid PRN for anxiety  4. Discharge Planning:              -- Social work and case management to assist with discharge planning and identification of hospital follow-up  needs prior to discharge             -- Estimated DoD: 12/4             -- Discharge Concerns: Need to establish a safety plan, medication compliance and effectiveness             -- Discharge Goals: Return home with outpatient  referrals for mental health follow-up including medication management/psychotherapy    Alesia Morin, MD 09/03/2022, 8:23 AM

## 2022-09-03 NOTE — Progress Notes (Signed)
[facility] Post 1:1 Observation Documentation  For the first (8) hours following discontinuation of 1:1 precautions, a progress note entry by nursing staff should be documented at least every 2 hours, reflecting the patient's behavior, condition, mood, and conversation.  Use the progress notes for additional entries.  Time 1:1 discontinued:  09/03/2022, 2300  Patient's Behavior:  Calm and cooperative  Patient's Condition:  Stable, no acute distress noted. Logical and coherent.   Patient's Conversation: Pt verbalized he is feeling better. He states his chronic pain has been properly managed and he did not require many medications for pain relief.   Marja Kays 09/03/2022, 11:51 PM

## 2022-09-03 NOTE — Group Note (Signed)
[facility] LCSW Group Therapy Note   Group Date: 09/03/2022 Start Time: 1015 End Time: 1115   Type of Therapy/Topic:  Group Therapy:  Balance in Life  Participation Level:  None   Description of Group:    This group will address the concept of balance and how it feels and looks when one is unbalanced. Patients will be encouraged to process areas in their lives that are out of balance, and identify reasons for remaining unbalanced. Facilitators will guide patients utilizing problem- solving interventions to address and correct the stressor making their life unbalanced. Understanding and applying boundaries will be explored and addressed for obtaining  and maintaining a balanced life. Patients will be encouraged to explore ways to assertively make their unbalanced needs known to significant others in their lives, using other group members and facilitator for support and feedback.  Therapeutic Goals: Patient will identify two or more emotions or situations they have that consume much of in their lives. Patient will identify signs/triggers that life has become out of balance:  Patient will identify two ways to set boundaries in order to achieve balance in their lives:  Patient will demonstrate ability to communicate their needs through discussion and/or role plays  Summary of Patient Progress:    Patient was present but did not participate in group    Therapeutic Modalities:   Cognitive Behavioral Therapy Solution-Focused Therapy Assertiveness Training   Marinda Elk, LCSW

## 2022-09-03 NOTE — Progress Notes (Signed)
D- Patient alert and oriented x4. Patient in stable mood.  Pt verbalized he gets depressed but he has started to feel better once he has been participating in activities today. Denies SI, HI, AVH. He verbally contracts for safety and states he has no self harm thoughts but would alert staff if he does.    A- Scheduled medications administered to patient, per MD orders.Routine safety checks conducted every 15 minutes.  Patient informed to notify staff with problems or concerns.   R- Patient compliant with medications and verbalized understanding treatment plan. Patient receptive, calm, and cooperative.     09/03/22 2030  Psych Admission Type (Psych Patients Only)  Admission Status Voluntary  Psychosocial Assessment  Patient Complaints Depression  Eye Contact Fair  Facial Expression Sad  Affect Depressed;Sad  Speech Logical/coherent;Soft  Interaction Assertive  Motor Activity Other (Comment) (WDL)  Appearance/Hygiene Unremarkable  Behavior Characteristics Cooperative;Calm  Mood Depressed;Sad  Thought Process  Coherency WDL  Content WDL  Delusions None reported or observed  Perception WDL  Hallucination None reported or observed  Judgment Limited  Confusion None  Danger to Self  Current suicidal ideation? Denies  Agreement Not to Harm Self Yes  Description of Agreement Verbal Contract  Danger to Others  Danger to Others None reported or observed

## 2022-09-03 NOTE — [facility] Group Notes (Signed)
Adult Psychoeducational Group  Date:  09/03/2022 Time:  1100-1200  Group Topic/Focus: Continuation of the group from Saturday. Looking at the lists that were created and talking about what needs to be done with the homework of 30 positives about themselves.                                     Talking about taking their power back and helping themselves to develop a positive self esteem.      Participation Quality:  did noty attend  Dione Housekeeper

## 2022-09-03 NOTE — Progress Notes (Signed)
The patient is awake at this time and is taking a shower.

## 2022-09-03 NOTE — [facility] Group Notes (Signed)
Adult Psychoeducational Group Note Date:  09/03/2022 Time:  0900-1000 Group Topic/Focus: PROGRESSIVE RELAXATION. A group where deep breathing is taught and tensing and relaxation muscle groups is used. Imagery is used as well.  Pts are asked to imagine 3 pillars that hold them up when they are not able to hold themselves up and to share that with the group.   Participation Level:  did not attend   : William Gallegos A  

## 2022-09-04 ENCOUNTER — Encounter (HOSPITAL_COMMUNITY): Payer: Self-pay

## 2022-09-04 LAB — CBC
HCT: 21.5 % — ABNORMAL LOW (ref 39.0–52.0)
Hemoglobin: 7.6 g/dL — ABNORMAL LOW (ref 13.0–17.0)
MCH: 29.1 pg (ref 26.0–34.0)
MCHC: 35.3 g/dL (ref 30.0–36.0)
MCV: 82.4 fL (ref 80.0–100.0)
Platelets: 336 10*3/uL (ref 150–400)
RBC: 2.61 MIL/uL — ABNORMAL LOW (ref 4.22–5.81)
RDW: 27 % — ABNORMAL HIGH (ref 11.5–15.5)
WBC: 14.3 10*3/uL — ABNORMAL HIGH (ref 4.0–10.5)
nRBC: 1.9 % — ABNORMAL HIGH (ref 0.0–0.2)

## 2022-09-04 LAB — HEPATIC FUNCTION PANEL
ALT: 13 U/L (ref 0–44)
AST: 24 U/L (ref 15–41)
Albumin: 3.5 g/dL (ref 3.5–5.0)
Alkaline Phosphatase: 45 U/L (ref 38–126)
Bilirubin, Direct: 0.3 mg/dL — ABNORMAL HIGH (ref 0.0–0.2)
Indirect Bilirubin: 3 mg/dL — ABNORMAL HIGH (ref 0.3–0.9)
Total Bilirubin: 3.3 mg/dL — ABNORMAL HIGH (ref 0.3–1.2)
Total Protein: 7.6 g/dL (ref 6.5–8.1)

## 2022-09-04 MED ORDER — HYDROXYZINE HCL 10 MG PO TABS
10.0000 mg | ORAL_TABLET | Freq: Three times a day (TID) | ORAL | Status: DC | PRN
  Administered 2022-09-04 – 2022-09-06 (×4): 10 mg via ORAL
  Filled 2022-09-04 (×2): qty 1
  Filled 2022-09-04: qty 10
  Filled 2022-09-04 (×3): qty 1

## 2022-09-04 MED ORDER — ARIPIPRAZOLE 2 MG PO TABS
2.0000 mg | ORAL_TABLET | Freq: Every day | ORAL | Status: DC
  Administered 2022-09-04 – 2022-09-07 (×4): 2 mg via ORAL
  Filled 2022-09-04 (×3): qty 1
  Filled 2022-09-04 (×2): qty 10
  Filled 2022-09-04 (×2): qty 1

## 2022-09-04 NOTE — BH IP Treatment Plan (Signed)
Interdisciplinary Treatment and Diagnostic Plan Update  09/04/2022 Time of Session: 8:30am, update Adeel Guiffre MRN: [id number]  Principal Diagnosis: Major depressive disorder, single episode, severe (HCC)  Secondary Diagnoses: Principal Problem:   Major depressive disorder, single episode, severe (HCC) Active Problems:   Sickle cell anemia (HCC)   Hyperbilirubinemia   Current Medications:  Current Facility-Administered Medications  Medication Dose Route Frequency Provider Last Rate Last Admin   acetaminophen (TYLENOL) tablet 650 mg  650 mg Oral Q6H PRN Dahlia Byes C, NP   650 mg at 09/03/22 1631   alum & mag hydroxide-simeth (MAALOX/MYLANTA) 200-200-20 MG/5ML suspension 30 mL  30 mL Oral Q4H PRN Dahlia Byes C, NP       escitalopram (LEXAPRO) tablet 20 mg  20 mg Oral Daily Kizzie Ide B, MD   20 mg at 09/04/22 0926   folic acid (FOLVITE) tablet 1 mg  1 mg Oral Daily Onuoha, Josephine C, NP   1 mg at 09/04/22 0844   hydroxyurea (HYDREA) capsule 1,000 mg  15 mg/kg Oral Daily Park Pope, MD   1,000 mg at 09/04/22 3710   hydrOXYzine (ATARAX) tablet 25 mg  25 mg Oral TID PRN Kizzie Ide B, MD   25 mg at 09/04/22 6269   magnesium hydroxide (MILK OF MAGNESIA) suspension 30 mL  30 mL Oral Daily PRN Dahlia Byes C, NP       oxyCODONE-acetaminophen (PERCOCET/ROXICET) 5-325 MG per tablet 1 tablet  1 tablet Oral Q6H PRN Dahlia Byes C, NP   1 tablet at 09/03/22 2117   traZODone (DESYREL) tablet 25 mg  25 mg Oral QHS PRN Bobbitt, Shalon E, NP       PTA Medications: No medications prior to admission.    Patient Stressors:    Patient Strengths:    Treatment Modalities: Medication Management, Group therapy, Case management,  1 to 1 session with clinician, Psychoeducation, Recreational therapy.   Physician Treatment Plan for Primary Diagnosis: Major depressive disorder, single episode, severe (HCC) Long Term Goal(s): Improvement in symptoms so as ready for  discharge   Short Term Goals: Ability to identify changes in lifestyle to reduce recurrence of condition will improve Ability to verbalize feelings will improve Ability to disclose and discuss suicidal ideas Ability to demonstrate self-control will improve Ability to identify and develop effective coping behaviors will improve Ability to maintain clinical measurements within normal limits will improve Compliance with prescribed medications will improve Ability to identify triggers associated with substance abuse/mental health issues will improve  Medication Management: Evaluate patient's response, side effects, and tolerance of medication regimen.  Therapeutic Interventions: 1 to 1 sessions, Unit Group sessions and Medication administration.  Evaluation of Outcomes: Progressing  Physician Treatment Plan for Secondary Diagnosis: Principal Problem:   Major depressive disorder, single episode, severe (HCC) Active Problems:   Sickle cell anemia (HCC)   Hyperbilirubinemia  Long Term Goal(s): Improvement in symptoms so as ready for discharge   Short Term Goals: Ability to identify changes in lifestyle to reduce recurrence of condition will improve Ability to verbalize feelings will improve Ability to disclose and discuss suicidal ideas Ability to demonstrate self-control will improve Ability to identify and develop effective coping behaviors will improve Ability to maintain clinical measurements within normal limits will improve Compliance with prescribed medications will improve Ability to identify triggers associated with substance abuse/mental health issues will improve     Medication Management: Evaluate patient's response, side effects, and tolerance of medication regimen.  Therapeutic Interventions: 1 to 1 sessions, Unit Group sessions  and Medication administration.  Evaluation of Outcomes: Progressing   RN Treatment Plan for Primary Diagnosis: Major depressive disorder,  single episode, severe (HCC) Long Term Goal(s): Knowledge of disease and therapeutic regimen to maintain health will improve  Short Term Goals: Ability to remain free from injury will improve, Ability to verbalize frustration and anger appropriately will improve, Ability to demonstrate self-control, Ability to participate in decision making will improve, Ability to verbalize feelings will improve, Ability to disclose and discuss suicidal ideas, Ability to identify and develop effective coping behaviors will improve, and Compliance with prescribed medications will improve  Medication Management: RN will administer medications as ordered by provider, will assess and evaluate patient's response and provide education to patient for prescribed medication. RN will report any adverse and/or side effects to prescribing provider.  Therapeutic Interventions: 1 on 1 counseling sessions, Psychoeducation, Medication administration, Evaluate responses to treatment, Monitor vital signs and CBGs as ordered, Perform/monitor CIWA, COWS, AIMS and Fall Risk screenings as ordered, Perform wound care treatments as ordered.  Evaluation of Outcomes: Progressing   LCSW Treatment Plan for Primary Diagnosis: Major depressive disorder, single episode, severe (HCC) Long Term Goal(s): Safe transition to appropriate next level of care at discharge, Engage patient in therapeutic group addressing interpersonal concerns.  Short Term Goals: Engage patient in aftercare planning with referrals and resources, Increase social support, Increase ability to appropriately verbalize feelings, Increase emotional regulation, Facilitate acceptance of mental health diagnosis and concerns, Facilitate patient progression through stages of change regarding substance use diagnoses and concerns, Identify triggers associated with mental health/substance abuse issues, and Increase skills for wellness and recovery  Therapeutic Interventions: Assess for  all discharge needs, 1 to 1 time with Social worker, Explore available resources and support systems, Assess for adequacy in community support network, Educate family and significant other(s) on suicide prevention, Complete Psychosocial Assessment, Interpersonal group therapy.  Evaluation of Outcomes: Progressing   Progress in Treatment: Attending groups: Yes. Participating in groups: Yes. Taking medication as prescribed: Yes. Toleration medication: Yes. Family/Significant other contact made: No, will contact:  decline consents Patient understands diagnosis: Yes. Discussing patient identified problems/goals with staff: Yes. Medical problems stabilized or resolved: Yes. Denies suicidal/homicidal ideation: Yes. Issues/concerns per patient self-inventory: No.  New problem(s) identified: No, Describe:  none reported   New Short Term/Long Term Goal(s):   medication stabilization, elimination of SI thoughts, development of comprehensive mental wellness plan.    Patient Goals:  Pt continues to work on initial treatment team goals.  Careteam will continue to provide interventions as necessary.  Discharge Plan or Barriers: Patient is homeless.  Patient has appointments for med management and therapy and primary care.   Reason for Continuation of Hospitalization: Anxiety Depression Medication stabilization Suicidal ideation  Estimated Length of Stay: 2-5 days  Last 3 [place] Suicide Severity Risk Score: Flowsheet Row [facility] (Current) [facility] from [facility]28/2022 [facility] to Hosp-Admission (Discharged) from 08/03/2022 [facility]  C-SSRS RISK CATEGORY High Risk High Risk No Risk       Last PHQ 2/9 Scores:    08/05/2019    9:37 AM 04/29/2019   10:00 AM  Depression screen PHQ 2/9  Decreased Interest 0 0  Down, Depressed, Hopeless 0 0  PHQ -  2 Score 0 0    Scribe for Treatment Team: Beatris Si, LCSW 09/04/2022 1:35 PM

## 2022-09-04 NOTE — Progress Notes (Signed)
[facility] Post 1:1 Observation Documentation  For the first (8) hours following discontinuation of 1:1 precautions, a progress note entry by nursing staff should be documented at least every 2 hours, reflecting the patient's behavior, condition, mood, and conversation.  Use the progress notes for additional entries.  Time 1:1 discontinued:  09/03/2022, 2300  Patient's Behavior:  Pt resting comfortably in bed.   Patient's Condition:  Stable, No acute distress noted.   Patient's Conversation:  Pt asleep. Prior to falling asleep, pt requested medications to help him fall asleep. Medications were effective.   Latunya Kissick 09/04/2022, 01:00 AM

## 2022-09-04 NOTE — Progress Notes (Signed)
D- Patient alert and oriented. Patient af A- PRN medications administered to patient, per MD orders. Support and encouragement provided.  Routine safety checks conducted every 15 minutes.  Patient informed to notify staff with problems or concerns. R- No adverse drug reactions noted. Patient contracts for safety at this time. Patient compliant with medications and treatment plan. Patient receptive, calm, and cooperative. Patient interacts well with others on the unit.  Patient remains safe at this time.

## 2022-09-04 NOTE — [facility] Group Notes (Signed)
Adult Psychoeducational Group Note  Date:  09/04/2022 Time:  9:34 AM  Group Topic/Focus:  Goals Group:   The focus of this group is to help patients establish daily goals to achieve during treatment and discuss how the patient can incorporate goal setting into their daily lives to aide in recovery.  Participation Level:  Active  Participation Quality:  Appropriate  Affect:  Appropriate  Cognitive:  Appropriate  Insight: Appropriate  Engagement in Group:  Engaged  Modes of Intervention:  Discussion   Donell Beers 09/04/2022, 9:34 AM

## 2022-09-04 NOTE — Plan of Care (Signed)
Nurse discussed anxiety, depression and coping skills with patient.  

## 2022-09-04 NOTE — Progress Notes (Signed)
[facility] Post 1:1 Observation Documentation  For the first (8) hours following discontinuation of 1:1 precautions, a progress note entry by nursing staff should be documented at least every 2 hours, reflecting the patient's behavior, condition, mood, and conversation.  Use the progress notes for additional entries.  Time 1:1 discontinued:  09/03/2022, 2300    Patient's Behavior:  Pt cooperative and calm.   Patient's Condition:  Stable, No acute distress noted.   Patient's Conversation:  Pt interacting positively with staff and peers.   William Gallegos 09/04/2022, 0700 AM

## 2022-09-04 NOTE — Progress Notes (Signed)
D:  Patient's self inventory sheet, patient has fair sleep, sleep medication helpful.  Good appetite, low energy level, good concentration.  Rated depression and anxiety 4, hopeless 1.  Denied withdrawals.  Denied SI.  Denied physical problems.  Denied physical pain.  Goal is be in dayroom.  Plans not to isolate.  No discharge plans. A:  Medications administered per MD orders.  Emotional support and encouragement given patient. R:  Denied SI and HI, contracts for safety.  Denied A/V hallucinations.  Safety maintained with 15 minute checks.

## 2022-09-04 NOTE — [facility] Group Notes (Signed)
Spiritual care group on grief and loss facilitated by chaplain Dyanne Carrel, [facility]  Group Goal:  Support / Education around grief and loss  Members engage in facilitated group support and psycho-social education.  Group Description:  Following introductions and group rules, group members engaged in facilitated group dialog and support around topic of loss, with particular support around experiences of loss in their lives. Group Identified types of loss (relationships / self / things) and identified patterns, circumstances, and changes that precipitate losses. Reflected on thoughts / feelings around loss, normalized grief responses, and recognized variety in grief experience. Group noted Worden's four tasks of grief in discussion.  Group drew on Adlerian / Rogerian, narrative, MI,  Patient Progress: William Gallegos attended group and while his verbal participation was minimal, he demonstrated engagement.  [address] Bcc Pager, [phone]

## 2022-09-04 NOTE — Progress Notes (Signed)
[facility] Post 1:1 Observation Documentation  For the first (8) hours following discontinuation of 1:1 precautions, a progress note entry by nursing staff should be documented at least every 2 hours, reflecting the patient's behavior, condition, mood, and conversation.  Use the progress notes for additional entries.  Time 1:1 discontinued:  09/03/2022, 2300    Patient's Behavior:  Pt asleep.  Patient's Condition:  Stable, no acute distress noted.   Patient's Conversation:  Pt asleep.   Linton Stolp 09/04/2022, 0500 AM

## 2022-09-04 NOTE — Progress Notes (Signed)
   09/04/22 2300  Psych Admission Type (Psych Patients Only)  Admission Status Voluntary  Psychosocial Assessment  Patient Complaints None  Eye Contact Fair  Facial Expression Sad  Affect Sad  Speech Logical/coherent  Interaction Assertive  Motor Activity Other (Comment) (wnl)  Appearance/Hygiene Unremarkable  Behavior Characteristics Cooperative  Mood Pleasant  Thought Process  Coherency WDL  Content WDL  Delusions None reported or observed  Perception WDL  Hallucination None reported or observed  Judgment Limited  Confusion None  Danger to Self  Current suicidal ideation? Denies  Agreement Not to Harm Self Yes  Description of Agreement Verbal  Danger to Others  Danger to Others None reported or observed

## 2022-09-04 NOTE — Progress Notes (Signed)
[facility] Post 1:1 Observation Documentation  For the first (8) hours following discontinuation of 1:1 precautions, a progress note entry by nursing staff should be documented at least every 2 hours, reflecting the patient's behavior, condition, mood, and conversation.  Use the progress notes for additional entries.  Time 1:1 discontinued:  09/03/2022, 2300   Patient's Behavior:  Pt calm, pt a little restless. Pt awake and taking a shower.   Patient's Condition:  Stable, No acute distress noted.    Patient's Conversation:  Pt verbalized some insomnia and wanted to take a shower.   Aldine Chakraborty 09/04/2022, 0300 AM

## 2022-09-04 NOTE — Progress Notes (Signed)
Pt awake and attempting to take a shower. Pt states he is "not sleepy."  2309: Earlier pt verbalized he wanted 25mg  of trazodone becuase he felt very tired in the morning after taking trazodone 50mg . Pt educated he had an additional dose of trazdone at 0130 on 12/3 and that could have contributed to his tiredness. Pt stated he will take his current order of 50mg  then, assess how he feels in the AM.

## 2022-09-04 NOTE — Group Note (Signed)
Recreation Therapy Group Note   Group Topic:Stress Management  Group Date: 09/04/2022 Start Time: 0935 End Time: 0948 Facilitators: Naithen Rivenburg-McCall, LRT,CTRS Location: [address] Dayroom   Goal Area(s) Addresses:  Patient will identify positive stress management techniques. Patient will identify benefits of using stress management post d/c.   Group Description:  Meditation.  LRT discussed meditation with patients to give them insights as to what it entails.  LRT then played a meditation that focused on being resilient in the face of adversity.  Patients were to listen and follow along as meditation played to fully engage in how the speaker was guiding them.  Patients were also encouraged to use other platforms to engage in stress management such as Youtube, apps, internet, etc.   Affect/Mood: Appropriate   Participation Level: Active   Participation Quality: Independent   Behavior: Appropriate   Speech/Thought Process: Focused   Insight: Good   Judgement: Good   Modes of Intervention: Meditation   Patient Response to Interventions:  Engaged   Education Outcome:  Acknowledges education and In group clarification offered    Clinical Observations/Individualized Feedback: Pt attended and participated in group session.    Plan: Continue to engage patient in RT group sessions 2-3x/week.   William Gallegos, LRT,CTRS 09/04/2022 11:43 AM

## 2022-09-04 NOTE — Progress Notes (Addendum)
[facility] MD Progress Note  09/04/2022 7:19 AM William Gallegos  MRN:  [id number]   Reason for Admission:  William Gallegos is a 21 y.o. male with no psychiatric history, who was initially admitted for inpatient psychiatric hospitalization on 08/29/2022 for management of SI with plan to shoot himself with a gun. The patient is currently on Hospital Day 6.   Chart Review from last 24 hours:  The patient's chart was reviewed and nursing notes were reviewed. The patient's case was discussed in multidisciplinary team meeting. Per [facility], patient was taking medications appropriately. Per nursing, patient is calm and cooperative.  Patient was on 1:1 yesterday and nursing note states that he was in the day room in the afternoon/evening time interacting with peers.  Nurse discussed with the team this morning that 1:1 was discontinued around 11 PM due to staffing issues.  He received the following PRN medications: Tylenol, Atarax 2x, Percocet  Information Obtained Today During Patient Interview: The patient was seen and evaluated on the unit. On assessment today the patient reports his mood as "I do not know how to feel".  He reports fair sleep and good appetite.  He notes that yesterday he did not go to group because he was tired but he stayed in the day room in the afternoon and evening and interacted with peers.  He felt that it helped him thoughts of wanting to hurt himself.  He reports last time having SI was 2 days ago.  Patient reports having no familial support but has met some friends during his hospital stay.  He denies SI, HI, AVH.    Principal Problem: Major depressive disorder, single episode, severe (HCC) Diagnosis: Principal Problem:   Major depressive disorder, single episode, severe (HCC) Active Problems:   Sickle cell anemia (HCC)   Hyperbilirubinemia    Past Psychiatric History: Previous Psych Diagnoses: none Prior inpatient treatment: denies Current/prior outpatient  treatment:denies Psychotherapy IT:GPQD History of suicide:none, chronic SI for several years History of homicide: none Psychiatric medication history:none Psychiatric medication compliance history:n/a Neuromodulation history: none Current Psychiatrist:none Current therapist: none  Past Medical History:  Past Medical History:  Diagnosis Date   Acute chest syndrome (HCC)    Seasonal allergies    Sickle cell anemia ([place])     Past Surgical History:  Procedure Laterality Date   adnoidectomy     CHOLECYSTECTOMY     TONSILLECTOMY AND ADENOIDECTOMY     Family History:  Family History  Problem Relation Age of Onset   Hypertension Other    Diabetes Other    Cancer Other    Stroke Other    Sickle cell anemia Brother    Sickle cell anemia Maternal Grandfather    Family Psychiatric  History: brother with likely depression SA/HA: brother attempted suicide ~5 years ago  Social History: Children: none Employment: unemployed Source of income: denies Housing: denies 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  Sleep: fair   Appetite: good  Current Medications: Current Facility-Administered Medications  Medication Dose Route Frequency Provider Last Rate Last Admin   acetaminophen (TYLENOL) tablet 650 mg  650 mg Oral Q6H PRN Charmaine Downs C, NP   650 mg at 09/03/22 1631   alum & mag hydroxide-simeth (MAALOX/MYLANTA) 200-200-20 MG/5ML suspension 30 mL  30 mL Oral Q4H PRN Charmaine Downs C, NP       escitalopram (LEXAPRO) tablet 20 mg  20 mg Oral Daily Coralyn Pear B, MD       folic acid (FOLVITE) tablet 1 mg  1 mg Oral Daily Onu[place],  Wynona Meals, NP   1 mg at 09/03/22 0947   hydroxyurea (HYDREA) capsule 1,000 mg  15 mg/kg Oral Daily France Ravens, MD   1,000 mg at 09/03/22 7902   hydrOXYzine (ATARAX) tablet 25 mg  25 mg Oral TID PRN Alesia Morin, MD   25 mg at 09/03/22 1632   magnesium hydroxide (MILK OF MAGNESIA) suspension 30 mL  30 mL Oral Daily PRN Delfin Gant, NP        oxyCODONE-acetaminophen (PERCOCET/ROXICET) 5-325 MG per tablet 1 tablet  1 tablet Oral Q6H PRN Delfin Gant, NP   1 tablet at 09/03/22 2117   traZODone (DESYREL) tablet 25 mg  25 mg Oral QHS PRN Bobbitt, Lennie Muckle, NP        Lab Results:  Results for orders placed or performed during the hospital encounter of 08/29/22 (from the past 48 hour(s))  CBC     Status: Abnormal   Collection Time: 09/04/22  6:32 AM  Result Value Ref Range   WBC 14.3 (H) 4.0 - 10.5 K/uL   RBC 2.61 (L) 4.22 - 5.81 MIL/uL   Hemoglobin 7.6 (L) 13.0 - 17.0 g/dL   HCT 21.5 (L) 39.0 - 52.0 %   MCV 82.4 80.0 - 100.0 fL   MCH 29.1 26.0 - 34.0 pg   MCHC 35.3 30.0 - 36.0 g/dL   RDW 27.0 (H) 11.5 - 15.5 %   Platelets 336 150 - 400 K/uL   nRBC 1.9 (H) 0.0 - 0.2 %    Comment: Performed at [facility], [place] [address] [place], [place] [address]    Blood Alcohol level:  Lab Results  Component Value Date   ETH <10 53/29/9242    Metabolic Disorder Labs: No results found for: "HGBA1C", "MPG" No results found for: "PROLACTIN" No results found for: "CHOL", "TRIG", "HDL", "CHOLHDL", "VLDL", "LDLCALC"   Musculoskeletal: Strength & Muscle Tone: within normal limits Gait & Station: normal Patient leans: N/A  Psychiatric Specialty Exam:  General Appearance: appears at stated age, casually dressed and groomed   Behavior: pleasant and cooperative  Psychomotor Activity: no psychomotor agitation or retardation noted   Eye Contact: fair Speech: normal amount, tone, decreased volume and fluency   Mood: dysthymic Affect: restricted  Thought Process: linear no circumstantial or tangential thought process noted, no racing thoughts or flight of ideas Descriptions of Associations: intact Thought Content: no bizarre content, logical and future-oriented Hallucinations: denies AH, VH , does not appear responding to stimuli Delusions: no paranoia, delusions of control, grandeur, ideas of reference,  thought broadcasting, and magical thinking Suicidal Thoughts: denies SI, intention, plan  Homicidal Thoughts: denies HI, intention, plan   Alertness/Orientation: alert and fully oriented  Insight: limited Judgment: limited  Memory: intact  Executive Functions  Concentration: intact  Attention Span: fair Recall: intact Fund of Knowledge: fair    Assets  Desire for Improvement; Armed forces logistics/support/administrative officer; Resilience    Physical Exam: Constitutional:      Appearance: Normal appearance.  Cardiovascular:     Rate and Rhythm: Normal rate.  Pulmonary:     Effort: Pulmonary effort is normal.  Neurological:     General: No focal deficit present.     Mental Status: Alert and oriented to person, place, and time.    Review of Systems  Constitutional: Negative.  Negative for chills, fever and weight loss.  HENT: Negative.    Eyes: Negative.   Respiratory: Negative.    Cardiovascular: Negative.   Gastrointestinal:  Negative for  constipation, diarrhea, nausea and vomiting.  Genitourinary: Negative.   Musculoskeletal: Negative.   Skin: Negative.   Neurological: Negative.  Negative for tingling.    ASSESSMENT: Benito Lemmerman is a 21 yo male w/ hx of sickle cell disease type SS c/b multiple admissions for sickle cell crisis, sickle cell anemia, acute chest syndrome and hyperbilirubinemia, homelessness, cannabis use disorder and no known PPHx presenting from [place] due to [place] with plan to shoot himself with gun he had confiscated by police.  This is hospitalization day 6.    Diagnoses / Active Problems: Principal Problem: Major depressive disorder, single episode, severe (HCC) Diagnosis: Principal Problem:   Major depressive disorder, single episode, severe (HCC) Active Problems:   Sickle cell anemia (HCC)   Hyperbilirubinemia   PLAN: Safety and Monitoring:  -- Voluntary admission to inpatient psychiatric unit for safety, stabilization and treatment  -- Daily contact with  patient to assess and evaluate symptoms and progress in treatment  -- Patient's case to be discussed in multi-disciplinary team meeting  -- Observation Level : q15 minute checks  -- Vital signs:  q12 hours  -- Precautions: suicide, elopement, and assault  2. Medications:    Psychiatric Diagnosis and treatment  Major Depressive Disorder, Recurrent episode, severe, without psychotic features Generalized anxiety disorder Cannabis use disorder --Continue Lexapro 20 mg for fluctuating SI, depressive symptoms and anxiety --Start Abilify 2 mg for adjunctive treatment for depression  --Encourage cannabis cessation -- The risks/benefits/side-effects/alternatives to this medication were discussed in detail with the patient and time was given for questions. The patient consents to medication trial.  -- Encouraged patient to participate in unit milieu and in scheduled group therapies      Medical Diagnosis and treatment  Sickle Cell Disease Thrombocytosis Leucocytosis Anemia Hbg trend: 8.0> 7.6.  Liver function panel shows elevated indirect bilirubin: 3.4> 3.0. --Continue hydroxyurea 1000 mg daily --Continue folic acid 1 mg daily --Provider previously spoke with Dorena Dew, FNP regarding recommendations and she thought it would be reasonable to restart hydroxyurea. He will need close follow up to ensure he can get patient assistance.  --pt has pcp appt on 12-12 -23 scheduled    PRN Medications The following PRN medications were added to ensure patient can focus on treatment. These were discussed with patient and patient aware of ability to ask for the following medications:  -Tylenol 650 mg q6hr for mild pain -Mylanta 30 ml q4hr suspension for indigestion -Milk of Magnesia 30 ml for constipation -Trazodone 25 mg qhs for insomnia -Decrease Hydroxyzine from 25 mg to 10 mg tid for anxiety    3. Pertinent labs: CBC WBC: 16.2>14.3 Hemoglobin: 8> 7.6  HFP Indirect bilirubin:  3.0     Lab ordered: none    4. Group and Therapy: -- Encouraged patient to participate in unit milieu and in scheduled group therapies     -- Short Term Goals: Ability to identify changes in lifestyle to reduce recurrence of condition will improve, Ability to verbalize feelings will improve, Ability to disclose and discuss suicidal ideas, Ability to demonstrate self-control will improve, Ability to identify and develop effective coping behaviors will improve, Ability to maintain clinical measurements within normal limits will improve, Compliance with prescribed medications will improve, and Ability to identify triggers associated with substance abuse/mental health issues will improve  -- Long Term Goals: Improvement in symptoms so as ready for discharge  5. Discharge Planning:   -- Social work and case management to assist with discharge planning and identification of hospital  follow-up needs prior to discharge  -- Estimated LOS: 5-7 days  -- Discharge Concerns: Need to establish a safety plan; Medication compliance and effectiveness  -- Discharge Goals: Return home with outpatient referrals for mental health follow-up including medication management/psychotherapy      Total Time Spent in Direct Patient Care:  I personally spent 30 minutes on the unit in direct patient care. The direct patient care time included face-to-face time with the patient, reviewing the patient's chart, communicating with other professionals, and coordinating care. Greater than 50% of this time was spent in counseling or coordinating care with the patient regarding goals of hospitalization, psycho-education, and discharge planning needs.   Alesia Morin, MD PGY-1 Psychiatry 09/04/2022, 7:19 AM

## 2022-09-04 NOTE — Progress Notes (Signed)
1:1 Note 0900  Patient's 1:1 was discontinued on 09/03/2022 at 11:00 pm.  Patient attended group this morning.  Patient denied SI and HI, contracts for safety.  Denied A/V hallucinations.  Respirations even and unlabored.  No signs/symptoms of pain/distress noted on patient's face/body movements.  Patient ate breakfast and has been walking in hallway and talking to his peers.  Patient stated he is feeling good.

## 2022-09-05 NOTE — [facility] Suicide Risk Assessment (Signed)
[facility] Discharge Suicide Risk Assessment  Principal Problem: Major depressive disorder, single episode, severe (HCC) Discharge Diagnoses: Principal Problem:   Major depressive disorder, single episode, severe (HCC) Active Problems:   Sickle cell anemia (HCC)   Hyperbilirubinemia   Total Time spent with patient: 30 minutes  William Gallegos is a 21 y.o. male with no psychiatric history, who was initially admitted for inpatient psychiatric hospitalization on 08/29/2022 for management of SI with plan to shoot himself with a gun.    During the patient's hospitalization, patient had extensive initial psychiatric evaluation, and follow-up psychiatric evaluations every day.  Psychiatric diagnoses provided upon initial assessment:   Major depressive disorder, single episode, severe (HCC) Active Problems:   Sickle cell anemia (HCC)   Hyperbilirubinemia  Patient's psychiatric medications were adjusted on admission: -- Continued lexapro 5mg    During the hospitalization, other adjustments were made to the patient's psychiatric medication regimen:  -- Increased Lexapro to 20 mg -- Started Abilify 2 mg  Patient's care was discussed during the interdisciplinary team meeting every day during the hospitalization.  The patient denies having side effects to prescribed psychiatric medication.  Gradually, patient started adjusting to milieu. The patient was evaluated each day by a clinical provider to ascertain response to treatment. Improvement was noted by the patient's report of decreasing symptoms, improved sleep and appetite, affect, medication tolerance, behavior, and participation in unit programming.  Patient was asked each day to complete a self inventory noting mood, mental status, pain, new symptoms, anxiety and concerns.    Symptoms were reported as significantly decreased or resolved completely by discharge.   On day of discharge, the patient reports that their mood is stable. The patient  denied having suicidal thoughts for more than 48 hours prior to discharge.  Patient denies having homicidal thoughts.  Patient denies having auditory hallucinations.  Patient denies any visual hallucinations or other symptoms of psychosis. The patient was motivated to continue taking medication with a goal of continued improvement in mental health.   The patient reports their target psychiatric symptoms of MDD with SI responded well to the psychiatric medications, and the patient reports overall benefit other psychiatric hospitalization. Supportive psychotherapy was provided to the patient. The patient also participated in regular group therapy while hospitalized. Coping skills, problem solving as well as relaxation therapies were also part of the unit programming.  Labs were reviewed with the patient, and abnormal results were discussed with the patient.  The patient is able to verbalize their individual safety plan to this provider.  # It is recommended to the patient to continue psychiatric medications as prescribed, after discharge from the hospital.    # It is recommended to the patient to follow up with your outpatient psychiatric provider and PCP. Follow up appointment on 12/12.  # It was discussed with the patient, the impact of alcohol, drugs, tobacco have been there overall psychiatric and medical wellbeing, and total abstinence from substance use was recommended the patient.ed.  # Prescriptions provided or sent directly to preferred pharmacy at discharge. Patient agreeable to plan. Given opportunity to ask questions. Appears to feel comfortable with discharge.    # In the event of worsening symptoms, the patient is instructed to call the crisis hotline, 911 and or go to the nearest ED for appropriate evaluation and treatment of symptoms. To follow-up with primary care provider for other medical issues, concerns and or health care needs  # Patient was discharged to a community facility  with a plan to follow  up as noted below.    Musculoskeletal: Strength & Muscle Tone: within normal limits Gait & Station: normal Patient leans: N/A  Psychiatric Specialty Exam  Mental Status Exam: General Appearance: appears at stated age, casually dressed and groomed    Behavior: pleasant and cooperative    Psychomotor Activity: no psychomotor agitation or retardation noted    Eye Contact: good  Speech: normal amount, tone, volume and fluency      Mood: euthymic, anxious about discharge Affect: congruent, pleasant and interactive    Thought Process: linear, goal directed, no circumstantial or tangential thought process noted, no racing thoughts or flight of ideas  Descriptions of Associations: intact  Thought Content: no bizarre content, logical and future-oriented  Hallucinations: denies AH, VH , does not appear responding to stimuli  Delusions: no paranoia, delusions of control, grandeur, ideas of reference, thought broadcasting, and magical thinking  Suicidal Thoughts: denies SI, intention, plan  Homicidal Thoughts: denies HI, intention, plan    Alertness/Orientation: alert and fully oriented   Insight: fair, improved  Judgment: fair, improved    Memory: intact    Executive Functions  Concentration: intact  Attention Span: fair  Recall: intact  Fund of Knowledge: fair    Physical Exam  Constitutional:      Appearance: Normal appearance.  Cardiovascular:     Rate and Rhythm: Normal rate.  Pulmonary:     Effort: Pulmonary effort is normal.  Neurological:     General: No focal deficit present.     Mental Status: Alert and oriented to person, place, and time.      Review of Systems  Constitutional: Negative.  Negative for chills, fever and weight loss.  HENT: Negative.    Eyes: Negative.   Respiratory: Negative.    Cardiovascular: Negative.   Gastrointestinal:  Negative for constipation, diarrhea, nausea and vomiting.  Genitourinary: Negative.    Musculoskeletal: Negative.   Skin: Negative.   Neurological: Negative.  Negative for tingling.    Blood pressure (!) 131/92, pulse (!) 109, temperature 98.3 F (36.8 C), temperature source Oral, resp. rate 19, height 5\' 9"  (1.753 m), weight 68.9 kg, SpO2 94 %. Body mass index is 22.45 kg/m.  Mental Status Per Nursing Assessment::   On Admission:  Suicidal ideation indicated by patient  Demographic Factors:  Male, Adolescent or young adult, Low socioeconomic status, and Living alone Loss Factors: Loss of significant relationship and Financial problems/change in socioeconomic status Historical Factors: Family history of suicide and Family history of mental illness or substance abuse Risk Reduction Factors:   NA   Continued Clinical Symptoms:  Depression:   Anhedonia  Cognitive Features That Contribute To Risk:  None    Suicide Risk:  Mild: There are no identifiable suicide plans, no associated intent, mild dysphoria and related symptoms, good self-control (both objective and subjective assessment), few other risk factors, and identifiable protective factors, including available and accessible social support. or Minimal: No identifiable suicidal ideation.  Patients presenting with no risk factors but with morbid ruminations; may be classified as minimal risk based on the severity of the depressive symptoms   Follow-up Information     [facility]. Go on 09/13/2022.   Specialty: Behavioral Health Why: You have an appointment for medication management services with Brittney on 09/13/22 @ 11:00 am.  You also have an appointment for therapy services with 09/15/22 for therapy services on 09/15/22 @ 9:00 am.  These appointments will be held in person. Contact information: [address]  [place] [address] [phone]        Massie Maroon, FNP. Go on 09/12/2022.   Specialty: Family Medicine Why: You have an appointment with your primary  care provider on 09/12/22 at 8:40 am.   This appointment will be held in person. Contact information: [address] [place] [address] [phone]         [facility] Follow up.   Contact information: [address] [place], [place] [address]  Main Phone: [phone]  Fax: [phone]                Plan Of Care/Follow-up recommendations:   Follow up appointment with PCP on 12/12.  Activity: as tolerated  Diet: heart healthy  Other: -Follow-up with your outpatient psychiatric provider -instructions on appointment date, time, and address (location) are provided to you in discharge paperwork.  -Take your psychiatric medications as prescribed at discharge - instructions are provided to you in the discharge paperwork  -Follow-up with outpatient primary care doctor and other specialists -for management of preventative medicine and chronic medical disease, including: sickle cell anemia  -Testing: Follow-up with outpatient provider for abnormal lab results: low Hbg  -Recommend abstinence from alcohol, tobacco, and other illicit drug use at discharge.   -If your psychiatric symptoms recur, worsen, or if you have side effects to your psychiatric medications, call your outpatient psychiatric provider, 911, [phone] or go to the nearest emergency department.  -If suicidal thoughts recur, call your outpatient psychiatric provider, 911, [phone] or go to the nearest emergency department.     Lance Muss, MD 09/07/2022, 9:45 AM

## 2022-09-05 NOTE — Progress Notes (Signed)
Pt on unit. Pt attending group. Pt presents with sad affect. Pt denies SI, reports feeling anxious and frustrated that he has not had success in talking to shelters he has been calling. Pt states he plans to keep trying. Pt received percocet one time today for pain. Q 15 minute checks ongoing.

## 2022-09-05 NOTE — Group Note (Signed)
LCSW Group Therapy Note   Group Date: 09/05/2022 Start Time: 1100 End Time: 1200  Type of Therapy and Topic: Group Therapy: Control  Participation Level: Active  Description of Group: In this group patients will discuss what is out of their control, what is somewhat in their control, and what is within their control.  They will be encouraged to explore what issues they can control and what issues are out of their control within their daily lives. They will be guided to discuss their thoughts, feelings, and behaviors related to these issues. The group will process together ways to better control things that are well within our own control and how to notice and accept the things that are not within our control. This group will be process-oriented, with patients participating in exploration of their own experiences as well as giving and receiving support and challenge from other group members.  During this group contains 3 worksheets will be provided to each patient to follow along and fill out.   Therapeutic Goals: 1. Patient will identify what is within their control and what is not within their control. 2. Patient will identify their thoughts and feelings about having control over their own lives. 3. Patient will identify their thoughts and feelings about not having control over everything in their lives. 4. Patient will identify ways that they can have more control over their own lives. 5. Patient will identify areas where they can allow others to help them or provide assistance.  Summary of Patient Progress: The Pt attended group and remained there the entire time.  The Pt accepted all worksheets and materials that were presented.  The Pt participated openly throughout the discussion and was able to identify areas where they can take control of their own life.  The Pt was appropriate with their peers and demonstrated an appropriate amount of self-control.   Eliyah Bazzi M Finis Hendricksen, LCSWA 09/05/2022   1:05 PM    

## 2022-09-05 NOTE — Progress Notes (Signed)
Patient rated his day as a 7 out of 10 because he had a good day. He did not provide additional details. His goal for tomorrow is to continue to work on finding a shelter.

## 2022-09-05 NOTE — Progress Notes (Signed)
[facility] MD Progress Note  09/05/2022 7:01 AM William Gallegos  MRN:  [id number]   Reason for Admission:  William Gallegos is a 21 y.o. male with no psychiatric history, who was initially admitted for inpatient psychiatric hospitalization on 08/29/2022 for management of SI with plan to shoot himself with a gun. The patient is currently on Hospital Day 7.   Chart Review from last 24 hours:  The patient's chart was reviewed and nursing notes were reviewed. The patient's case was discussed in multidisciplinary team meeting. Per [facility], patient was taking medications appropriately. Per nursing, patient is calm and cooperative and attended 3 groups. The following PRN medications were given overnight: trazodone, atarax   Information Obtained Today During Patient Interview: Patient was seen in the milieu today.  On assessment, patient reports feeling good.  He said he felt happy yesterday and attributed this to participating in group sessions.  He reports good sleep and appetite.  He feels his medications have been helpful, denying adverse effects.  He denies feelings of sadness, SI, HI, AVH.  Discussed potential discharge tomorrow if he continues to participate in group and take his medication and he is agreeable to this.    Principal Problem: Major depressive disorder, single episode, severe (HCC) Diagnosis: Principal Problem:   Major depressive disorder, single episode, severe (HCC) Active Problems:   Sickle cell anemia (HCC)   Hyperbilirubinemia    Past Psychiatric History: Previous Psych Diagnoses: none Prior inpatient treatment: denies Current/prior outpatient treatment:denies Psychotherapy YT:KPTW History of suicide:none, chronic SI for several years History of homicide: none Psychiatric medication history:none Psychiatric medication compliance history:n/a Neuromodulation history: none Current Psychiatrist:none Current therapist: none  Past Medical History:  Past Medical History:   Diagnosis Date   Acute chest syndrome (HCC)    Seasonal allergies    Sickle cell anemia (HCC)     Past Surgical History:  Procedure Laterality Date   adnoidectomy     CHOLECYSTECTOMY     TONSILLECTOMY AND ADENOIDECTOMY     Family History:  Family History  Problem Relation Age of Onset   Hypertension Other    Diabetes Other    Cancer Other    Stroke Other    Sickle cell anemia Brother    Sickle cell anemia Maternal Grandfather    Family Psychiatric  History: brother with likely depression SA/HA: brother attempted suicide ~5 years ago  Social History: Children: none Employment: unemployed Source of income: denies Housing: denies Hotel manager: denies  Sleep: fair   Appetite: good  Current Medications: Current Facility-Administered Medications  Medication Dose Route Frequency Provider Last Rate Last Admin   acetaminophen (TYLENOL) tablet 650 mg  650 mg Oral Q6H PRN Dahlia Byes C, NP   650 mg at 09/03/22 1631   alum & mag hydroxide-simeth (MAALOX/MYLANTA) 200-200-20 MG/5ML suspension 30 mL  30 mL Oral Q4H PRN Dahlia Byes C, NP       ARIPiprazole (ABILIFY) tablet 2 mg  2 mg Oral Daily Kizzie Ide B, MD   2 mg at 09/04/22 1456   escitalopram (LEXAPRO) tablet 20 mg  20 mg Oral Daily Kizzie Ide B, MD   20 mg at 09/04/22 0926   folic acid (FOLVITE) tablet 1 mg  1 mg Oral Daily Onuoha, Josephine C, NP   1 mg at 09/04/22 0844   hydroxyurea (HYDREA) capsule 1,000 mg  15 mg/kg Oral Daily Park Pope, MD   1,000 mg at 09/04/22 0926   hydrOXYzine (ATARAX) tablet 10 mg  10 mg Oral TID PRN Oda Cogan,  Barnett Hatter, MD   10 mg at 09/04/22 2105   magnesium hydroxide (MILK OF MAGNESIA) suspension 30 mL  30 mL Oral Daily PRN Earney Navy, NP       oxyCODONE-acetaminophen (PERCOCET/ROXICET) 5-325 MG per tablet 1 tablet  1 tablet Oral Q6H PRN Earney Navy, NP   1 tablet at 09/03/22 2117   traZODone (DESYREL) tablet 25 mg  25 mg Oral QHS PRN Bobbitt, Shalon E, NP   25 mg  at 09/04/22 2105    Lab Results:  Results for orders placed or performed during the hospital encounter of 08/29/22 (from the past 48 hour(s))  CBC     Status: Abnormal   Collection Time: 09/04/22  6:32 AM  Result Value Ref Range   WBC 14.3 (H) 4.0 - 10.5 K/uL   RBC 2.61 (L) 4.22 - 5.81 MIL/uL   Hemoglobin 7.6 (L) 13.0 - 17.0 g/dL   HCT 54.0 (L) 08.6 - 76.1 %   MCV 82.4 80.0 - 100.0 fL   MCH 29.1 26.0 - 34.0 pg   MCHC 35.3 30.0 - 36.0 g/dL   RDW 95.0 (H) 93.2 - 67.1 %   Platelets 336 150 - 400 K/uL   nRBC 1.9 (H) 0.0 - 0.2 %    Comment: Performed at [facility], [address], [place], [place] [address]  Hepatic function panel     Status: Abnormal   Collection Time: 09/04/22  6:32 AM  Result Value Ref Range   Total Protein 7.6 6.5 - 8.1 g/dL   Albumin 3.5 3.5 - 5.0 g/dL   AST 24 15 - 41 U/L   ALT 13 0 - 44 U/L   Alkaline Phosphatase 45 38 - 126 U/L   Total Bilirubin 3.3 (H) 0.3 - 1.2 mg/dL   Bilirubin, Direct 0.3 (H) 0.0 - 0.2 mg/dL   Indirect Bilirubin 3.0 (H) 0.3 - 0.9 mg/dL    Comment: Performed at [facility], [address], [place], [place] [address]    Blood Alcohol level:  Lab Results  Component Value Date   ETH <10 08/29/2022    Metabolic Disorder Labs: No results found for: "HGBA1C", "MPG" No results found for: "PROLACTIN" No results found for: "CHOL", "TRIG", "HDL", "CHOLHDL", "VLDL", "LDLCALC"   Musculoskeletal: Strength & Muscle Tone: within normal limits Gait & Station: normal Patient leans: N/A  Psychiatric Specialty Exam:  General Appearance: appears at stated age, casually dressed and groomed   Behavior: pleasant and cooperative   Psychomotor Activity: no psychomotor agitation or retardation noted   Eye Contact: fair Speech: decreased volume. normal amount, tone, and fluency    Mood: euthymic Affect: congruent, pleasant and interactive, restrictive at times  Thought Process: linear, goal  directed, no circumstantial or tangential thought process  Descriptions of Associations: intact  Thought Content: no bizarre content, logical and future-oriented  Hallucinations: denies AH, VH , does not appear responding to stimuli  Delusions: no paranoia, delusions of control, grandeur, ideas of reference, thought broadcasting, and magical thinking  Suicidal Thoughts: denies SI, intention, plan  Homicidal Thoughts: denies HI, intention, plan   Alertness/Orientation: alert and fully oriented   Insight: fair, improved  Judgment: fair, improved   Memory: intact   Executive Functions  Concentration: intact  Attention Span: fair  Recall: intact  Fund of Knowledge: fair      Assets  Desire for Improvement; Manufacturing systems engineer; Resilience    Physical Exam  Constitutional:      Appearance: Normal appearance.  Cardiovascular:     Rate and Rhythm: Normal rate.  Pulmonary:     Effort: Pulmonary effort is normal.  Neurological:     General: No focal deficit present.     Mental Status: Alert and oriented to person, place, and time.    Review of Systems  Constitutional: Negative.  Negative for chills, fever and weight loss.  HENT: Negative.    Eyes: Negative.   Respiratory: Negative.    Cardiovascular: Negative.   Gastrointestinal:  Negative for constipation, diarrhea, nausea and vomiting.  Genitourinary: Negative.   Musculoskeletal: Negative.   Skin: Negative.   Neurological: Negative.  Negative for tingling.      ASSESSMENT: William Gallegos is a 21 yo male w/ hx of sickle cell disease type SS c/b multiple admissions for sickle cell crisis, sickle cell anemia, acute chest syndrome and hyperbilirubinemia, homelessness, cannabis use disorder and no known PPHx presenting from [facility] due to SI with plan to shoot himself with gun he had confiscated by police.  This is hospitalization day 7.    Diagnoses / Active Problems: Principal Problem: Major depressive disorder, single  episode, severe (HCC) Diagnosis: Principal Problem:   Major depressive disorder, single episode, severe (HCC) Active Problems:   Sickle cell anemia (HCC)   Hyperbilirubinemia   PLAN: Safety and Monitoring:  -- Voluntary admission to inpatient psychiatric unit for safety, stabilization and treatment  -- Daily contact with patient to assess and evaluate symptoms and progress in treatment  -- Patient's case to be discussed in multi-disciplinary team meeting  -- Observation Level : q15 minute checks  -- Vital signs:  q12 hours  -- Precautions: suicide, elopement, and assault  2. Medications:    Psychiatric Diagnosis and treatment  Major Depressive Disorder, Recurrent episode, severe, without psychotic features Generalized anxiety disorder Cannabis use disorder --Continue Lexapro 20 mg for fluctuating SI, depressive symptoms and anxiety --Continue Abilify 2 mg for adjunctive treatment for depression  --Encourage cannabis cessation -- The risks/benefits/side-effects/alternatives to this medication were discussed in detail with the patient and time was given for questions. The patient consents to medication trial.  -- Encouraged patient to participate in unit milieu and in scheduled group therapies      Medical Diagnosis and treatment  Sickle Cell Disease Thrombocytosis Leucocytosis Anemia --Continue hydroxyurea 1000 mg daily --Continue folic acid 1 mg daily --Provider previously spoke with Massie MaroonHollis, Lachina M, FNP regarding recommendations and she thought it would be reasonable to restart hydroxyurea. He will need close follow up to ensure he can get patient assistance.  --pt has pcp appt on 12-12 -23 scheduled    PRN Medications The following PRN medications were added to ensure patient can focus on treatment. These were discussed with patient and patient aware of ability to ask for the following medications:  -Tylenol 650 mg q6hr for mild pain -Mylanta 30 ml q4hr suspension  for indigestion -Milk of Magnesia 30 ml for constipation -Trazodone 25 mg qhs for insomnia -Hydroxyzine 10 mg tid for anxiety    3. Pertinent labs: CBC WBC: 16.2>14.3 Hemoglobin: 8> 7.6  HFP Indirect bilirubin: 3.0     Lab ordered: none    4. Group and Therapy: -- Encouraged patient to participate in unit milieu and in scheduled group therapies     -- Short Term Goals: Ability to identify changes in lifestyle to reduce recurrence of condition will improve, Ability to verbalize feelings will improve, Ability to disclose and discuss suicidal ideas, Ability to demonstrate self-control will improve, Ability to identify and  develop effective coping behaviors will improve, Ability to maintain clinical measurements within normal limits will improve, Compliance with prescribed medications will improve, and Ability to identify triggers associated with substance abuse/mental health issues will improve  -- Long Term Goals: Improvement in symptoms so as ready for discharge  5. Discharge Planning:   -- Social work and case management to assist with discharge planning and identification of hospital follow-up needs prior to discharge  -- Estimated LOS: 1-2 days  -- Discharge Concerns: Need to establish a safety plan; Medication compliance and effectiveness  -- Discharge Goals: Return home with outpatient referrals for mental health follow-up including medication management/psychotherapy      Total Time Spent in Direct Patient Care:  I personally spent 30 minutes on the unit in direct patient care. The direct patient care time included face-to-face time with the patient, reviewing the patient's chart, communicating with other professionals, and coordinating care. Greater than 50% of this time was spent in counseling or coordinating care with the patient regarding goals of hospitalization, psycho-education, and discharge planning needs.   Lance Muss, MD PGY-1 Psychiatry 09/05/2022, 7:01 AM

## 2022-09-05 NOTE — Progress Notes (Signed)
[facility] LCSW Progress Note   09/05/2022    11:40 AM  William Gallegos      Type of Note: Live/Work Resources   CSW provided patient with a list of transitional housing and highlighted the two he had interest in; [facility] [place] [employer]    Signed:   Jacob Moores, MSW, LCSWA 09/05/2022 11:40 AM

## 2022-09-05 NOTE — Discharge Summary (Signed)
Physician Discharge Summary Note   Patient:  William Gallegos is an 21 y.o., male MRN:  [id number] DOB:  13-Oct-2000 Patient phone:  [phone] (home)  Patient address:   [address] [place] [address],  Total Time spent with patient: 30 minutes  Date of Admission:  08/29/2022 Date of Discharge: 09/07/2022  Reason for Admission:  MDD with SI  Principal Problem: Major depressive disorder, single episode, severe (HCC) Discharge Diagnoses: Principal Problem:   Major depressive disorder, single episode, severe (HCC) Active Problems:   Sickle cell anemia (HCC)   Hyperbilirubinemia   Past Psychiatric History: Previous Psych Diagnoses: none Prior inpatient treatment: denies Current/prior outpatient treatment:denies Psychotherapy MW:NUUV History of suicide:none, chronic SI for several years History of homicide: none Psychiatric medication history:none Psychiatric medication compliance history:n/a Neuromodulation history: none Current Psychiatrist:none Current therapist: none  Past Medical History:  Past Medical History:  Diagnosis Date   Acute chest syndrome (HCC)    Seasonal allergies    Sickle cell anemia (HCC)     Past Surgical History:  Procedure Laterality Date   adnoidectomy     CHOLECYSTECTOMY     TONSILLECTOMY AND ADENOIDECTOMY      Family History:  Psych: brother with likely depression SA/HA: brother attempted suicide ~5 years ago  Social History:  Children: none Employment: unemployed Source of income: denies Housing: denies Military: denies  Hospital Course:   During the patient's hospitalization, patient had extensive initial psychiatric evaluation, and follow-up psychiatric evaluations every day.  Psychiatric diagnoses provided upon initial assessment: Principal Problem:   Major depressive disorder, single episode, severe (HCC) Active Problems:   Sickle cell anemia (HCC)   Hyperbilirubinemia   The following medications were  managed:  Upon admission, the following medications were changed / started / discontinued: -- Continued lexapro 5mg   During the patient's stay, the following medications were changed / started / discontinued, with final adjustments by discharge: -- Increased Lexapro to 20 mg -- Started Abilify 2 mg  During the hospitalization, patient had the following lab / imaging / testing abnormalities which require further evaluation / management / treatment: CBC, HFP  Patient's care was discussed during the interdisciplinary team meeting every day during the hospitalization.  The patient denies any side effects to prescribed psychiatric medication.  William Gallegos is a 21 y.o. male with no psychiatric history, who was initially admitted for inpatient psychiatric hospitalization on 08/29/2022 for management of SI with plan to shoot himself with a gun.   Gradually, patient started adjusting to milieu. The patient was evaluated each day by a clinical provider to ascertain response to treatment. Improvement was noted by the patient's report of decreasing symptoms, improved sleep and appetite, affect, medication tolerance, behavior, and participation in unit programming.  Patient was asked each day to complete a self inventory noting mood, mental status, pain, new symptoms, anxiety and concerns.    Symptoms were reported as significantly decreased or resolved completely by discharge.   On day of discharge, the patient reports that their mood is stable. The patient denied having suicidal thoughts for more than 48 hours prior to discharge.  Patient denies having homicidal thoughts.  Patient denies having auditory hallucinations.  Patient denies any visual hallucinations or other symptoms of psychosis. The patient was motivated to continue taking medication with a goal of continued improvement in mental health.   The patient reports their target psychiatric symptoms of MDD and SI responded well to the  psychiatric medications, and the patient reports overall benefit other psychiatric hospitalization. Supportive  psychotherapy was provided to the patient. The patient also participated in regular group therapy while hospitalized. Coping skills, problem solving as well as relaxation therapies were also part of the unit programming.  Labs were reviewed with the patient, and abnormal results were discussed with the patient.  The patient is able to verbalize their individual safety plan to this provider.  Behavioral Events: was placed on 1:1 on 12/3   Restraints: none  # It is recommended to the patient to continue psychiatric medications as prescribed, after discharge from the hospital.    # It is recommended to the patient to follow up with your outpatient psychiatric provider and PCP.  # It was discussed with the patient, the impact of alcohol, drugs, tobacco have been there overall psychiatric and medical wellbeing, and total abstinence from substance use was recommended to the patient.  # Prescriptions provided or sent directly to preferred pharmacy at discharge. Patient agreeable to plan. Given opportunity to ask questions. Appears to feel comfortable with discharge.    # In the event of worsening symptoms, the patient is instructed to call the crisis hotline, 911 and or go to the nearest ED for appropriate evaluation and treatment of symptoms. To follow-up with primary care provider for other medical issues, concerns and or health care needs  # Patient was discharged to a community facility with a plan to follow up as noted below.  Physical Findings: AIMS: Facial and Oral Movements Muscles of Facial Expression: None, normal Lips and Perioral Area: None, normal Jaw: None, normal Tongue: None, normal,Extremity Movements Upper (arms, wrists, hands, fingers): None, normal Lower (legs, knees, ankles, toes): None, normal, Trunk Movements Neck, shoulders, hips: None, normal, Overall  Severity Severity of abnormal movements (highest score from questions above): None, normal Incapacitation due to abnormal movements: None, normal Patient's awareness of abnormal movements (rate only patient's report): No Awareness, Dental Status Current problems with teeth and/or dentures?: No Does patient usually wear dentures?: No   EPS: No dystonia, akathisia, tremor, rigidity, shuffling gait   Mental Status Exam: General Appearance: appears at stated age, casually dressed and groomed   Behavior: pleasant and cooperative   Psychomotor Activity: no psychomotor agitation or retardation noted   Eye Contact: good  Speech: normal amount, tone, volume and fluency    Mood: euthymic, anxious about discharge Affect: congruent, pleasant and interactive   Thought Process: linear, goal directed, no circumstantial or tangential thought process noted, no racing thoughts or flight of ideas  Descriptions of Associations: intact  Thought Content: no bizarre content, logical and future-oriented  Hallucinations: denies AH, VH , does not appear responding to stimuli  Delusions: no paranoia, delusions of control, grandeur, ideas of reference, thought broadcasting, and magical thinking  Suicidal Thoughts: denies SI, intention, plan  Homicidal Thoughts: denies HI, intention, plan   Alertness/Orientation: alert and fully oriented  Insight: fair, improved  Judgment: fair, improved   Memory: intact   Executive Functions  Concentration: intact  Attention Span: fair  Recall: intact  Fund of Knowledge: fair   Physical Exam  Constitutional:      Appearance: Normal appearance.  Cardiovascular:     Rate and Rhythm: Normal rate.  Pulmonary:     Effort: Pulmonary effort is normal.  Neurological:     General: No focal deficit present.     Mental Status: Alert and oriented to person, place, and time.    Review of Systems  Constitutional: Negative.  Negative for chills, fever and weight loss.   HENT: Negative.  Eyes: Negative.   Respiratory: Negative.    Cardiovascular: Negative.   Gastrointestinal:  Negative for constipation, diarrhea, nausea and vomiting.  Genitourinary: Negative.   Musculoskeletal: Negative.   Skin: Negative.   Neurological: Negative.  Negative for tingling.     Blood pressure (!) 131/92, pulse (!) 109, temperature 98.3 F (36.8 C), temperature source Oral, resp. rate 19, height 5\' 9"  (1.753 m), weight 68.9 kg, SpO2 94 %. Body mass index is 22.45 kg/m.  Assets  Assets:Desire for Improvement; Communication Skills; Resilience   Social History   Tobacco Use  Smoking Status Never  Smokeless Tobacco Never   Tobacco Cessation:  N/A, patient does not currently use tobacco products  Blood Alcohol level:  Lab Results  Component Value Date   ETH <10 [id number]    Metabolic Disorder Labs:  No results found for: "HGBA1C", "MPG" No results found for: "PROLACTIN" Lab Results  Component Value Date   CHOL 122 09/06/2022   TRIG 116 09/06/2022   HDL 35 (L) 09/06/2022   CHOLHDL 3.5 09/06/2022   VLDL 23 09/06/2022   LDLCALC 64 09/06/2022    Discharge destination: to a community facility  Is patient on multiple antipsychotic therapies at discharge:  No   Has Patient had three or more failed trials of antipsychotic monotherapy by history:  No  Recommended Plan for Multiple Antipsychotic Therapies: NA  Discharge Instructions     Diet - low sodium heart healthy   Complete by: As directed    Increase activity slowly   Complete by: As directed       Allergies as of 09/07/2022       Reactions   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  Indication: Pain   ARIPiprazole 2 MG tablet Commonly known as: ABILIFY Take 1 tablet (2 mg total) by mouth daily. Start taking on: September 08, 2022  Indication: Major Depressive Disorder   escitalopram 20 MG tablet Commonly known as: LEXAPRO Take 1 tablet (20 mg total) by mouth daily. Start taking on: September 08, 2022  Indication: Major Depressive Disorder   folic acid 1 MG tablet Commonly known as: FOLVITE Take 1 tablet (1 mg total) by mouth daily.  Indication: Anemia From Inadequate Folic Acid   hydroxyurea 500 MG capsule Commonly known as: HYDREA Take 2 capsules (1,000 mg total) by mouth daily for 4 days. May take with food to minimize GI side effects. Start taking on: September 08, 2022  Indication: sickle cell   hydrOXYzine 10 MG tablet Commonly known as: ATARAX Take 1 tablet (10 mg total) by mouth 3 (three) times daily as needed for anxiety.  Indication: Feeling Anxious   oxyCODONE-acetaminophen 5-325 MG tablet Commonly known as: PERCOCET/ROXICET Take 1 tablet by mouth daily as needed for moderate pain.  Indication: Pain        Follow-up Information     [place]. Go on 09/13/2022.   Specialty: Behavioral Health Why: You have an appointment for medication management services with Brittney on 09/13/22 @ 11:00 am.  You also have an appointment for therapy services with Doroteo Bradford for therapy services on 09/15/22 @ 9:00 am.  These appointments will be held in person. Contact information: [place]        Dorena Dew, FNP. Go on 09/12/2022.   Specialty: Family Medicine Why: You have an appointment with your primary care provider on 09/12/22 at 8:40 am.   This appointment will be held in person. Contact information: [address] [place] [address] [facility] Follow up.   Contact information: [address] [place], [place] [address]  Main Phone: [phone]  Fax: [phone]                Discharge recommendations:   Activity: as tolerated  Diet: heart healthy  # It is recommended to the patient to continue psychiatric medications as prescribed, after discharge from the hospital.     # It is recommended to the patient to follow up with your outpatient psychiatric provider -instructions on appointment date, time, and address (location) are provided to you in discharge paperwork  # Follow-up with outpatient primary care doctor and other specialists -for management of chronic medical disease, including: sickle cell anemia. Follow up appointment on 12/12.  # Testing: Follow-up with outpatient provider for abnormal lab results: low Hgb, low TIBC   # It was discussed with the patient, the impact of alcohol, drugs, tobacco have been there overall psychiatric and medical wellbeing, and total abstinence from substance use was recommended to the patient.   # Prescriptions provided or sent directly to preferred pharmacy at discharge. Patient agreeable to plan. Given opportunity to ask questions. Appears to feel comfortable with discharge.    # In the event of worsening symptoms, the patient is instructed to call the crisis hotline, 911, and or go to the nearest ED for appropriate evaluation and treatment of symptoms. To follow-up with primary care provider for other medical issues, concerns and or health care needs  Patient agrees with D/C instructions and plan.   I discussed my assessment, planned testing and intervention for the patient with Dr. Caswell Corwin who agrees with my formulated course of action.   Signed: Alesia Morin, MD, PGY-1 09/07/2022, 9:45 AM

## 2022-09-06 LAB — LIPID PANEL
Cholesterol: 122 mg/dL (ref 0–200)
HDL: 35 mg/dL — ABNORMAL LOW (ref >40)
LDL Cholesterol: 64 mg/dL (ref 0–99)
Total CHOL/HDL Ratio: 3.5 RATIO
Triglycerides: 116 mg/dL (ref <150)
VLDL: 23 mg/dL (ref 0–40)

## 2022-09-06 NOTE — Progress Notes (Signed)
Patient approaches the nurses station and reports "I am having really bad priapism." Patient reports taking a warm shower x2 in an attempt for the symptoms to go down. Patient reports pain 5/10. Channing Mutters NP notified.

## 2022-09-06 NOTE — Progress Notes (Signed)
[facility] LCSW Progress Note   09/06/2022    11:20 AM  William Gallegos      Type of Note: Patient Request to see CSW  CSW went to see patient. Patient mentioned how his transitional housing information was thrown away by accident. CSW did reprint information and provided it to him. However, CSW informed patient that as of right now his anticipated DC is tomorrow and from the looks of it,  he will be discharged to the [facility] shelter or [facility], his choice, since he had these resources in his folder as well; just in case [facility] [place] or [facility] does not work out. Patient was iffy about the [facility] in [place] because he does not have family there so CSW encouraged patient to call local shelters and [facility] [place].     Signed:   Jacob Moores, MSW, [facility] 09/06/2022 11:20 AM

## 2022-09-06 NOTE — Progress Notes (Signed)
Write was notify that patient was having priapism,  upon assessment pt report he has been experiencing this symptom for the past 1-2 hour,  he however stated it was feeling a little better his pain was at a 5/10,  but come down to now 3/10.  Pt reported he experience these symptom in the past while at home.  According to patient he normally would talk a hot and cold shower wish helps.  Will d/c all trazodone order.  Nurse to re-assess patient in 30 minutes for improvement of symptoms. Will evaluate in 30 minutes.

## 2022-09-06 NOTE — Progress Notes (Signed)
   09/05/22 2200  Psych Admission Type (Psych Patients Only)  Admission Status Voluntary  Psychosocial Assessment  Patient Complaints Anxiety  Eye Contact Fair  Facial Expression Flat  Affect Sad  Speech Logical/coherent  Interaction Assertive  Motor Activity Other (Comment) ([facility])  Appearance/Hygiene Unremarkable  Behavior Characteristics Cooperative  Mood Pleasant  Thought Process  Coherency WDL  Content WDL  Delusions None reported or observed  Perception WDL  Hallucination None reported or observed  Judgment WDL  Confusion None  Danger to Self  Current suicidal ideation? Denies  Agreement Not to Harm Self Yes  Description of Agreement Verbal  Danger to Others  Danger to Others None reported or observed

## 2022-09-06 NOTE — Plan of Care (Signed)
Nurse discussed coping skills with patient.  

## 2022-09-06 NOTE — Group Note (Signed)
Recreation Therapy Group Note   Group Topic:Team Building  Group Date: 09/06/2022 Start Time: 0935 End Time: 1000 Facilitators: Susumu Hackler-McCall, LRT,CTRS Location: [address] Dayroom   Goal Area(s) Addresses:  Patient will effectively work with peer towards shared goal.  Patient will identify skills used to make activity successful.  Patient will identify how skills used during activity can be applied to reach post d/c goals.    Group Description: [employer]. In teams of 5-6, patients were given 11 craft pipe cleaners. Using the materials provided, patients were instructed to compete again the opposing team(s) to build the tallest free-standing structure from floor level. The activity was timed; difficulty increased by Clinical research associate as Production designer, theatre/television/film continued.  Systematically resources were removed with additional directions for example, placing one arm behind their back, working in silence, and shape stipulations. LRT facilitated post-activity discussion reviewing team processes and necessary communication skills involved in completion. Patients were encouraged to reflect how the skills utilized, or not utilized, in this activity can be incorporated to positively impact support systems post discharge.   Affect/Mood: N/A   Participation Level: Did not attend    Clinical Observations/Individualized Feedback:      Plan: Continue to engage patient in RT group sessions 2-3x/week.   Cambree Hendrix-McCall, LRT,CTRS 09/06/2022 11:09 AM

## 2022-09-06 NOTE — Progress Notes (Addendum)
[facility] MD Progress Note  09/06/2022 6:46 AM William Gallegos  MRN:  [id number]   Reason for Admission:  William Gallegos is a 21 y.o. male with no psychiatric history, who was initially admitted for inpatient psychiatric hospitalization on 08/29/2022 for management of SI with plan to shoot himself with a gun. The patient is currently on Hospital Day 8.   Chart Review from last 24 hours:  The patient's chart was reviewed and nursing notes were reviewed. The patient's case was discussed in multidisciplinary team meeting. Per [facility], patient was taking medications appropriately. Per nursing, patient is calm and cooperative and attended 1 group session.  Overnight, patient went to the nurses station and noted priapism, trazodone was discontinued.  The following as needed medications were given: Hydroxyzine 1X, oxycodone 1X    Information Obtained Today During Patient Interview: Patient was seen at bedside today.  On assessment, patient feels fine.  Continues to deny SI, HI, and AVH.  Reports attending group yesterday.  He notes that he has been having priapism that started over the weekend.  It would occur overnight and it would awaken him.  He took 2 showers in the night which relieved the symptoms.  Discussed with the patient that that is a side effect of trazodone and the medication was discontinued.  As patient has sickle cell anemia, that is a risk factor of priapism when taking trazodone.  Patient reports reaching out to work living facilities but he said that they hung up on him twice due to not being able to hear him on the phone.    Principal Problem: Major depressive disorder, single episode, severe (HCC) Diagnosis: Principal Problem:   Major depressive disorder, single episode, severe (HCC) Active Problems:   Sickle cell anemia (HCC)   Hyperbilirubinemia    Past Psychiatric History: Previous Psych Diagnoses: none Prior inpatient treatment: denies Current/prior outpatient  treatment:denies Psychotherapy XF:GHWE History of suicide:none, chronic SI for several years History of homicide: none Psychiatric medication history:none Psychiatric medication compliance history:n/a Neuromodulation history: none Current Psychiatrist:none Current therapist: none  Past Medical History:  Past Medical History:  Diagnosis Date   Acute chest syndrome (HCC)    Seasonal allergies    Sickle cell anemia (HCC)     Past Surgical History:  Procedure Laterality Date   adnoidectomy     CHOLECYSTECTOMY     TONSILLECTOMY AND ADENOIDECTOMY     Family History:  Family History  Problem Relation Age of Onset   Hypertension Other    Diabetes Other    Cancer Other    Stroke Other    Sickle cell anemia Brother    Sickle cell anemia Maternal Grandfather    Family Psychiatric  History: brother with likely depression SA/HA: brother attempted suicide ~5 years ago  Social History: Children: none Employment: unemployed Source of income: denies Housing: denies Hotel manager: denies  Sleep: fair   Appetite: good  Current Medications: Current Facility-Administered Medications  Medication Dose Route Frequency Provider Last Rate Last Admin   acetaminophen (TYLENOL) tablet 650 mg  650 mg Oral Q6H PRN Dahlia Byes C, NP   650 mg at 09/03/22 1631   alum & mag hydroxide-simeth (MAALOX/MYLANTA) 200-200-20 MG/5ML suspension 30 mL  30 mL Oral Q4H PRN Dahlia Byes C, NP       ARIPiprazole (ABILIFY) tablet 2 mg  2 mg Oral Daily Kizzie Ide B, MD   2 mg at 09/05/22 0810   escitalopram (LEXAPRO) tablet 20 mg  20 mg Oral Daily Lance Muss, MD  20 mg at [id number]   folic acid (FOLVITE) tablet 1 mg  1 mg Oral Daily Onuoha, Josephine C, NP   1 mg at 09/05/22 0810   hydroxyurea (HYDREA) capsule 1,000 mg  15 mg/kg Oral Daily France Ravens, MD   1,000 mg at 09/05/22 0810   hydrOXYzine (ATARAX) tablet 10 mg  10 mg Oral TID PRN Coralyn Pear B, MD   10 mg at 09/05/22 2146    magnesium hydroxide (MILK OF MAGNESIA) suspension 30 mL  30 mL Oral Daily PRN Charmaine Downs C, NP       oxyCODONE-acetaminophen (PERCOCET/ROXICET) 5-325 MG per tablet 1 tablet  1 tablet Oral Q6H PRN Charmaine Downs C, NP   1 tablet at 09/05/22 1116    Lab Results:  No results found for this or any previous visit (from the past 48 hour(s)).   Blood Alcohol level:  Lab Results  Component Value Date   ETH <10 [id number]    Metabolic Disorder Labs: No results found for: "HGBA1C", "MPG" No results found for: "PROLACTIN" No results found for: "CHOL", "TRIG", "HDL", "CHOLHDL", "VLDL", "LDLCALC"   Musculoskeletal: Strength & Muscle Tone: within normal limits Gait & Station: normal Patient leans: N/A  Psychiatric Specialty Exam:  General Appearance: appears at stated age, casually dressed and groomed   Behavior: pleasant and cooperative   Psychomotor Activity: no psychomotor agitation or retardation noted   Eye Contact: good  Speech: normal amount, tone, volume and fluency    Mood: euthymic  Affect: congruent, pleasant and interactive   Thought Process: linear, goal directed, no circumstantial or tangential thought process noted, no racing thoughts or flight of ideas  Descriptions of Associations: intact  Thought Content: no bizarre content, logical and future-oriented  Hallucinations: denies AH, VH , does not appear responding to stimuli  Delusions: no paranoia, delusions of control, grandeur, ideas of reference, thought broadcasting, and magical thinking  Suicidal Thoughts: denies SI, intention, plan  Homicidal Thoughts: denies HI, intention, plan   Alertness/Orientation: alert and fully oriented   Insight: fair, improved  Judgment: fair, improved   Memory: intact   Executive Functions  Concentration: intact  Attention Span: fair  Recall: intact  Fund of Knowledge: fair       Assets  Desire for Improvement; Armed forces logistics/support/administrative officer;  Resilience    Physical Exam  Constitutional:      Appearance: Normal appearance.  Cardiovascular:     Rate and Rhythm: Normal rate.  Pulmonary:     Effort: Pulmonary effort is normal.  Neurological:     General: No focal deficit present.     Mental Status: Alert and oriented to person, place, and time.   Review of Systems  Constitutional: Negative.  Negative for chills, fever and weight loss.  HENT: Negative.    Eyes: Negative.   Respiratory: Negative.    Cardiovascular: Negative.   Gastrointestinal:  Negative for constipation, diarrhea, nausea and vomiting.  Genitourinary: Negative.   Musculoskeletal: Negative.   Skin: Negative.  Priapism not present Neurological: Negative.  Negative for tingling.     ASSESSMENT: William Gallegos is a 22 yo male w/ hx of sickle cell disease type SS c/b multiple admissions for sickle cell crisis, sickle cell anemia, acute chest syndrome and hyperbilirubinemia, homelessness, cannabis use disorder and no known PPHx presenting from [place] due to [place] with plan to shoot himself with gun he had confiscated by police.  This is hospitalization day 7.    Diagnoses / Active Problems: Principal Problem: Major depressive disorder,  single episode, severe (HCC) Diagnosis: Principal Problem:   Major depressive disorder, single episode, severe (HCC) Active Problems:   Sickle cell anemia (HCC)   Hyperbilirubinemia   PLAN: Safety and Monitoring:  -- Voluntary admission to inpatient psychiatric unit for safety, stabilization and treatment  -- Daily contact with patient to assess and evaluate symptoms and progress in treatment  -- Patient's case to be discussed in multi-disciplinary team meeting  -- Observation Level : q15 minute checks  -- Vital signs:  q12 hours  -- Precautions: suicide, elopement, and assault  2. Medications:    Psychiatric Diagnosis and treatment  Major Depressive Disorder, Recurrent episode, severe, without psychotic  features Generalized anxiety disorder Cannabis use disorder --Continue Lexapro 20 mg for fluctuating SI, depressive symptoms and anxiety --Continue Abilify 2 mg for adjunctive treatment for depression  --Encourage cannabis cessation -- The risks/benefits/side-effects/alternatives to this medication were discussed in detail with the patient and time was given for questions. The patient consents to medication trial.  -- Encouraged patient to participate in unit milieu and in scheduled group therapies      Medical Diagnosis and treatment  Sickle Cell Disease Thrombocytosis Leucocytosis Anemia --Continue hydroxyurea 1000 mg daily --Continue folic acid 1 mg daily --Provider previously spoke with Dorena Dew, FNP regarding recommendations and she thought it would be reasonable to restart hydroxyurea. He will need close follow up to ensure he can get patient assistance.  --pt has pcp appt on 12-12 -23 scheduled    PRN Medications The following PRN medications were added to ensure patient can focus on treatment. These were discussed with patient and patient aware of ability to ask for the following medications:  -Discontinue trazodone 25 mg qhs due to priapism -Tylenol 650 mg q6hr for mild pain -Mylanta 30 ml q4hr suspension for indigestion -Milk of Magnesia 30 ml for constipation -Hydroxyzine 10 mg tid for anxiety    3. Pertinent labs: CBC 12/4 WBC: 16.2>14.3 Hemoglobin: 8> 7.6  HFP 12/4 Indirect bilirubin: 3.0     Lab ordered: Lipid panel and A1c    4. Group and Therapy: -- Encouraged patient to participate in unit milieu and in scheduled group therapies     -- Short Term Goals: Ability to identify changes in lifestyle to reduce recurrence of condition will improve, Ability to verbalize feelings will improve, Ability to disclose and discuss suicidal ideas, Ability to demonstrate self-control will improve, Ability to identify and develop effective coping behaviors will  improve, Ability to maintain clinical measurements within normal limits will improve, Compliance with prescribed medications will improve, and Ability to identify triggers associated with substance abuse/mental health issues will improve  -- Long Term Goals: Improvement in symptoms so as ready for discharge  5. Discharge Planning:   -- Social work and case management to assist with discharge planning and identification of hospital follow-up needs prior to discharge  -- Estimated LOS: 1-2 days  -- Discharge Concerns: Need to establish a safety plan; Medication compliance and effectiveness  -- Discharge Goals: [facility] facility with outpatient referrals for mental health follow-up including medication management/psychotherapy      Total Time Spent in Direct Patient Care:  I personally spent 30 minutes on the unit in direct patient care. The direct patient care time included face-to-face time with the patient, reviewing the patient's chart, communicating with other professionals, and coordinating care. Greater than 50% of this time was spent in counseling or coordinating care with the patient regarding goals of hospitalization, psycho-education, and discharge planning needs.  Alesia Morin, MD PGY-1 Psychiatry 09/06/2022, 6:46 AM

## 2022-09-06 NOTE — Progress Notes (Signed)
[facility] LCSW Progress Note   09/06/2022    4:31 PM  Janard Kruk      Type of Note: Follow Up about DC plans  CSW provided patient with some information to [facility] in [place] such as a road map to get to there after getting off [employer] and what to except upon admission once he gets there. However, patient did not like the fact that he could not go out into the community to get his own job and would be on "lock-down" for 30-days. CSW gave information and allowed patient to think about what was discussed, then returned 30 minutes later. Upon returning, CSW asked patient what his thoughts were and he asked about the [facility] shelter, CSW re-introduced the [place] and [facility] shelter along with assurance of transportation of a taxi to there with bus passes depending on if he goes to [employer] or not. Patient states that he will call [facility]. However, CSW asked again about "mom"  and patient got frustrated stating, " why everyone keep bringing up my mom, we do not have a good relationship". CSW explained that doing a collateral call to mom could possible help him return back with mom, patient declined. CSW left after seeing patient getting upset and stated, " will see you tomorrow, let me know then if you need anything".     Signed:   Jacob Moores, MSW, [facility] 09/06/2022 4:31 PM

## 2022-09-06 NOTE — Progress Notes (Signed)
D:   Patient's self inventory sheet, patient has fair sleep, sleep medication helpful.  Good appetite, low energy level, good concentration.  Rated depression 2, anxiety 6, denied hopeless.  Denied withdrawals.  Denied physical problems.  Denied physical pain.  Goal is go to shelter.  Plans to call shelters.  Does have discharge plans. A:  Medications administered per MD orders.  Emotional support and encouragement given patient. R:  Denied SI and HI, contracts for safety.  Denied A/V hallucinations.  Safety maintained with 15 minute checks.

## 2022-09-06 NOTE — Progress Notes (Signed)
Roy NP at bedside, states to observe the patient for an hour and 30 minutes, to see if the symptoms. Channing Mutters NP states that the Trazodone that the patient has been taking has been causing the priapism symptoms and that the patient should not be taking this medication anymore.

## 2022-09-07 LAB — HEMOGLOBIN A1C
Hgb A1c MFr Bld: <4.2 % — ABNORMAL LOW (ref 4.8–5.6)
Mean Plasma Glucose: <74 mg/dL

## 2022-09-07 MED ORDER — HYDROXYZINE HCL 10 MG PO TABS: 10.0000 mg | ORAL_TABLET | Freq: Three times a day (TID) | ORAL | 0 refills | Status: DC | PRN

## 2022-09-07 MED ORDER — ACETAMINOPHEN 325 MG PO TABS: 650.0000 mg | ORAL_TABLET | Freq: Four times a day (QID) | ORAL | 0 refills | Status: AC | PRN

## 2022-09-07 MED ORDER — FOLIC ACID 1 MG PO TABS: 1.0000 mg | ORAL_TABLET | Freq: Every day | ORAL | 11 refills | Status: AC

## 2022-09-07 MED ORDER — ESCITALOPRAM OXALATE 20 MG PO TABS: 20.0000 mg | ORAL_TABLET | Freq: Every day | ORAL | 1 refills | Status: DC

## 2022-09-07 MED ORDER — HYDROXYUREA 500 MG PO CAPS: 15.0000 mg/kg | ORAL_CAPSULE | Freq: Every day | ORAL | 0 refills | Status: AC

## 2022-09-07 MED ORDER — ARIPIPRAZOLE 2 MG PO TABS: 2.0000 mg | ORAL_TABLET | Freq: Every day | ORAL | 1 refills | Status: DC

## 2022-09-07 MED ORDER — OXYCODONE-ACETAMINOPHEN 5-325 MG PO TABS: 1.0000 | ORAL_TABLET | Freq: Every day | ORAL | 0 refills | Status: DC | PRN

## 2022-09-07 NOTE — Progress Notes (Signed)
D- Patient alert and oriented. Denies SI, HI, AVH, and pain. Patient reports being anxious regarding discharge and housing. Patient stated that he wanted to talk to the doctor about the possibility of moving to [place] upon discharge. Patient states that he doesn't know anyone who is there currently but "I want a fresh start."  A- PRN hydroxyzine administered to patient, per MAR. Education on measures to prevent sickle cell crisis, pain control, and on how to reduce anxiety provided. Support and encouragement provided.  Routine safety checks conducted every 15 minutes.  Patient informed to notify staff with problems or concerns.  R- No adverse drug reactions noted. Patient contracts for safety at this time. Patient compliant with medications and treatment plan. Patient receptive, calm, and cooperative. Patient interacts well with others on the unit.  Patient remains safe at this time.

## 2022-09-07 NOTE — Progress Notes (Signed)
Pt discharged to lobby. Pt was stable and appreciative at that time. All papers, samples and prescriptions were given and valuables returned. Verbal understanding expressed. Denies SI/HI and A/VH. Pt given opportunity to express concerns and ask questions.  

## 2022-09-07 NOTE — Progress Notes (Signed)
   09/07/22 0529  Sleep  Number of Hours 6.25

## 2022-09-07 NOTE — Progress Notes (Signed)
  [facility] Adult Case Management Discharge Plan :  Will you be returning to the same living situation after discharge:  Yes,  Patient will be DC to [facility]  At discharge, do you have transportation home?: Yes,  CSW arranged a taxi for 11:15 AM  Do you have the ability to pay for your medications: Yes,  patient states that he has insurance due to his illness of Sickle Cell   Release of information consent forms completed and in the chart;  Patient's signature needed at discharge.  Patient to Follow up at:  Follow-up Information     [facility]. Go on 09/13/2022.   Specialty: Behavioral Health Why: You have an appointment for medication management services with Brittney on 09/13/22 @ 11:00 am.  You also have an appointment for therapy services with Cicero Duck for therapy services on 09/15/22 @ 9:00 am.  These appointments will be held in person. Contact information: [address] [place] [address] [phone]        Massie Maroon, FNP. Go on 09/12/2022.   Specialty: Family Medicine Why: You have an appointment with your primary care provider on 09/12/22 at 8:40 am.   This appointment will be held in person. Contact information: [address] [place] [address] [phone]         [facility] Follow up.   Contact information: [address], [place], [place] [address]  Main Phone: [phone]  Fax: [phone]                Next level of care provider has access to [facility] Link:yes  Safety Planning and Suicide Prevention discussed: No.Patient declined all consents      Has patient been referred to the Quitline?: N/A patient is not a smoker  Patient has been referred for addiction treatment: N/A  Isabella Bowens, LCSWA 09/07/2022, 10:28 AM

## 2022-09-07 NOTE — Plan of Care (Signed)
°  Problem: Education: °Goal: Emotional status will improve °Outcome: Progressing °Goal: Mental status will improve °Outcome: Progressing °Goal: Verbalization of understanding the information provided will improve °Outcome: Progressing °  °

## 2022-09-07 NOTE — Discharge Instructions (Signed)
-  Follow-up with your outpatient psychiatric provider -instructions on appointment date, time, and address (location) are provided to you in discharge paperwork.  -Take your psychiatric medications as prescribed at discharge - instructions are provided to you in the discharge paperwork  -Follow-up with outpatient primary care doctor and other specialists -for management of preventative medicine and any chronic medical disease.  -Recommend abstinence from alcohol, tobacco, and other illicit drug use at discharge.   -If your psychiatric symptoms recur, worsen, or if you have side effects to your psychiatric medications, call your outpatient psychiatric provider, 911, [phone] or go to the nearest emergency department.  -If suicidal thoughts occur, call your outpatient psychiatric provider, 911, [phone] or go to the nearest emergency department.  Naloxone (Narcan) can help reverse an overdose when given to the victim quickly.  [place] offers free naloxone kits and instructions/training on its use.  Add naloxone to your first aid kit and you can help save a life.   Pick up your free kit at the following locations:   [place]:  [facility], [address] [place] [address] ([phone]) [facility] [address] [place] [id number] ([phone]) [facility] [address] [place] [address]  [place]: [facility] [address] [place] [address] ([phone]) [facility] [address] [place] [address] ([phone])  

## 2022-09-12 ENCOUNTER — Ambulatory Visit: Payer: Self-pay | Admitting: Family Medicine

## 2022-09-13 ENCOUNTER — Encounter (HOSPITAL_COMMUNITY): Payer: Self-pay | Admitting: Psychiatry

## 2022-09-13 ENCOUNTER — Ambulatory Visit (INDEPENDENT_AMBULATORY_CARE_PROVIDER_SITE_OTHER): Payer: No Payment, Other | Admitting: Psychiatry

## 2022-09-13 ENCOUNTER — Encounter (HOSPITAL_COMMUNITY): Payer: Self-pay

## 2022-09-13 DIAGNOSIS — F411 Generalized anxiety disorder: Secondary | ICD-10-CM

## 2022-09-13 DIAGNOSIS — F32A Depression, unspecified: Secondary | ICD-10-CM | POA: Diagnosis not present

## 2022-09-13 MED ORDER — ARIPIPRAZOLE 2 MG PO TABS: 2.0000 mg | ORAL_TABLET | Freq: Every day | ORAL | 3 refills | Status: AC

## 2022-09-13 MED ORDER — ESCITALOPRAM OXALATE 20 MG PO TABS: 20.0000 mg | ORAL_TABLET | Freq: Every day | ORAL | 3 refills | Status: AC

## 2022-09-13 MED ORDER — HYDROXYZINE HCL 10 MG PO TABS: 10.0000 mg | ORAL_TABLET | Freq: Three times a day (TID) | ORAL | 3 refills | Status: DC | PRN

## 2022-09-13 MED ORDER — HYDROXYZINE HCL 25 MG PO TABS: 25.0000 mg | ORAL_TABLET | Freq: Three times a day (TID) | ORAL | 3 refills | Status: AC | PRN
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
## 2022-09-15 ENCOUNTER — Encounter (HOSPITAL_COMMUNITY): Payer: Self-pay

## 2022-09-15 ENCOUNTER — Ambulatory Visit (INDEPENDENT_AMBULATORY_CARE_PROVIDER_SITE_OTHER): Payer: No Payment, Other | Admitting: Mental Health

## 2022-09-15 DIAGNOSIS — F411 Generalized anxiety disorder: Secondary | ICD-10-CM | POA: Insufficient documentation

## 2022-09-15 DIAGNOSIS — F322 Major depressive disorder, single episode, severe without psychotic features: Secondary | ICD-10-CM
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
## 2022-09-19 ENCOUNTER — Ambulatory Visit (INDEPENDENT_AMBULATORY_CARE_PROVIDER_SITE_OTHER): Payer: Self-pay | Admitting: Family Medicine

## 2022-09-19 ENCOUNTER — Encounter: Payer: Self-pay | Admitting: Family Medicine

## 2022-09-19 VITALS — BP 106/60 | HR 98 | Temp 97.6°F | Ht 69.0 in | Wt 161.2 lb

## 2022-09-19 DIAGNOSIS — E559 Vitamin D deficiency, unspecified: Secondary | ICD-10-CM

## 2022-09-19 DIAGNOSIS — D571 Sickle-cell disease without crisis: Secondary | ICD-10-CM

## 2022-09-19 DIAGNOSIS — F322 Major depressive disorder, single episode, severe without psychotic features: Secondary | ICD-10-CM

## 2022-09-19 DIAGNOSIS — D5700 Hb-SS disease with crisis, unspecified: Secondary | ICD-10-CM

## 2022-09-19 MED ORDER — IBUPROFEN 600 MG PO TABS: 600.0000 mg | ORAL_TABLET | Freq: Three times a day (TID) | ORAL | 0 refills | Status: AC | PRN

## 2022-09-19 MED ORDER — HYDROXYUREA 500 MG PO CAPS: 500.0000 mg | ORAL_CAPSULE | Freq: Every day | ORAL | 1 refills | Status: AC

## 2022-09-19 MED ORDER — OXYCODONE-ACETAMINOPHEN 5-325 MG PO TABS: 1.0000 | ORAL_TABLET | Freq: Three times a day (TID) | ORAL | 0 refills | Status: AC | PRN

## 2022-09-19 NOTE — Progress Notes (Signed)
[facility]IENT [facility]ARE CENTER [address] [place] [address]                                   Transitional Care Clinic   [facility] Discharge Acute Issues Care Follow Up                                                                       Complete  Units weekly  Patient Demographics  William Gallegos, is a 21 y.o. male  DOB November 03, 2000  MRN [id number].  Primary MD  Massie Maroon, FNP   History of present illness: William Gallegos is a 21 year old male with a medical history significant for sickle cell disease, vitamin D deficiencySignificant for sickle cell trait chronic, anemia of chronic disease, and major depressive disorder presents for a post hospital follow-up.  Patient was recently hospitalized for major depression and generalized anxiety.  At that time, patient had extensive therapy and was started on antidepressants and antianxiety medications.  Patient states that he has been taking prescribed medications consistently.  He has also been following up with counselor as an outpatient. Patient has a history of sickle cell disease.  Patient sickle cell is generally well-controlled he has infrequent pain crisis and has been lost to follow-up over the past several years.  Patient is currently not on any disease modifying agents and is hoping to restart his hydroxyurea.  Patient is also interested in restarting folic acid consistently.  He is not having any significant pain on today.  He has chronic pain mostly occurs in his low back and he typically takes over-the-counter Tylenol and ibuprofen for pain control. He currently denies any shortness of breath, chest pain, urinary symptoms, nausea, vomiting, or diarrhea.  He endorses anxiety.  He denies any suicidal or homicidal intentions on today.   Past Medical History:  Diagnosis Date   Acute chest syndrome (HCC)    Seasonal allergies    Sickle cell anemia (HCC)     Past Surgical History:  Procedure Laterality Date    adnoidectomy     CHOLECYSTECTOMY     TONSILLECTOMY AND ADENOIDECTOMY              Subjective:   Past Medical History:  Diagnosis Date   Acute chest syndrome (HCC)    Seasonal allergies    Sickle cell anemia (HCC)     Social History   Socioeconomic History   Marital status: Single    Spouse name: Not on file   Number of children: Not on file   Years of education: Not on file   Highest education level: Not on file  Occupational History   Not on file  Tobacco Use   Smoking status: Never   Smokeless tobacco: Never  Vaping Use   Vaping Use: Not on file  Substance and Sexual Activity   Alcohol use: No   Drug use: No   Sexual activity: Yes  Other Topics Concern   Not on file  Social History Narrative   Not on file   Social Determinants of Health   Financial Resource Strain: Medium Risk (09/15/2022)   Overall Financial Resource Strain (CARDIA)  Difficulty of Paying Living Expenses: Somewhat hard  Food Insecurity: No Food Insecurity (08/30/2022)   Hunger Vital Sign    Worried About Running Out of Food in the Last Year: Never true    Ran Out of Food in the Last Year: Never true  Transportation Needs: No Transportation Needs (08/30/2022)   PRAPARE - Administrator, Civil ServiceTransportation    Lack of Transportation (Medical): No    Lack of Transportation (Non-Medical): No  Physical Activity: Sufficiently Active (09/15/2022)   Exercise Vital Sign    Days of Exercise per Week: 5 days    Minutes of Exercise per Session: 60 min  Stress: Stress Concern Present (09/15/2022)   [employer] - Occupational Stress Questionnaire    Feeling of Stress : Very much  Social Connections: Socially Isolated (09/15/2022)   Social Connection and Isolation Panel [NHANES]    Frequency of Communication with Friends and Family: More than three times a week    Frequency of Social Gatherings with Friends and Family: More than three times a week    Attends Religious Services: Never     Database administratorActive Member of Clubs or Organizations: No    Attends BankerClub or Organization Meetings: Never    Marital Status: Never married  Intimate Partner Violence: Not At Risk (08/30/2022)   Humiliation, Afraid, Rape, and Kick questionnaire    Fear of Current or Ex-Partner: No    Emotionally Abused: No    Physically Abused: No    Sexually Abused: No   Review of Systems  Constitutional:  Negative for chills, fever and malaise/fatigue.  Respiratory: Negative.    Cardiovascular: Negative.   Gastrointestinal: Negative.   Genitourinary: Negative.   Musculoskeletal:  Positive for joint pain.  Skin: Negative.   Neurological: Negative.   Psychiatric/Behavioral:  Negative for depression. The patient is nervous/anxious.     Assessment & Plan    There are no diagnoses linked to this encounter.   Reason for frequent admissions/ER visits **    Physical Exam Constitutional:      Appearance: Normal appearance.  Eyes:     Pupils: Pupils are equal, round, and reactive to light.  Cardiovascular:     Rate and Rhythm: Normal rate.     Pulses: Normal pulses.  Pulmonary:     Effort: Pulmonary effort is normal.  Abdominal:     General: Bowel sounds are normal.  Musculoskeletal:        General: Normal range of motion.  Skin:    General: Skin is warm.  Neurological:     General: No focal deficit present.     Mental Status: He is alert. Mental status is at baseline.  Psychiatric:        Mood and Affect: Mood normal.        Thought Content: Thought content normal.        Judgment: Judgment normal.      Objective:   There were no vitals filed for this visit.  Wt Readings from Last 3 Encounters:  08/29/22 160 lb (72.6 kg)  08/03/22 170 lb (77.1 kg)  08/18/19 164 lb 8 oz (74.6 kg)    Allergies as of 09/19/2022       Reactions   Carrot [daucus [place]] Anaphylaxis, Swelling   Swelling of face    Peanuts [peanut Oil] Anaphylaxis, Swelling   Swelling of face    Erythromycin Hives    Other Swelling   Raisins--- facial swelling Mushrooms---face swelling        Medication List  Accurate as of September 19, 2022  8:41 AM. If you have any questions, ask your nurse or doctor.          acetaminophen 325 MG tablet Commonly known as: TYLENOL Take 2 tablets (650 mg total) by mouth every 6 (six) hours as needed for mild pain.   ARIPiprazole 2 MG tablet Commonly known as: ABILIFY Take 1 tablet (2 mg total) by mouth daily.   escitalopram 20 MG tablet Commonly known as: LEXAPRO Take 1 tablet (20 mg total) by mouth daily.   folic acid 1 MG tablet Commonly known as: FOLVITE Take 1 tablet (1 mg total) by mouth daily.   hydrOXYzine 25 MG tablet Commonly known as: ATARAX Take 1 tablet (25 mg total) by mouth 3 (three) times daily as needed for anxiety.   oxyCODONE-acetaminophen 5-325 MG tablet Commonly known as: PERCOCET/ROXICET Take 1 tablet by mouth daily as needed for moderate pain.        Data Review   Micro Results No results found for this or any previous visit (from the past 240 hour(s)).   CBC No results for input(s): "WBC", "HGB", "HCT", "PLT", "MCV", "MCH", "MCHC", "RDW", "LYMPHSABS", "MONOABS", "EOSABS", "BASOSABS", "BANDABS" in the last 168 hours.  Invalid input(s): "NEUTRABS", "BANDSABD"  Chemistries  No results for input(s): "NA", "K", "CL", "CO2", "GLUCOSE", "BUN", "CREATININE", "CALCIUM", "MG", "AST", "ALT", "ALKPHOS", "BILITOT" in the last 168 hours.  Invalid input(s): "GFRCGP" ------------------------------------------------------------------------------------------------------------------ CrCl cannot be calculated (Patient's most recent lab result is older than the maximum 21 days allowed.). ------------------------------------------------------------------------------------------------------------------ No results for input(s): "HGBA1C" in the last 72  hours. ------------------------------------------------------------------------------------------------------------------ No results for input(s): "CHOL", "HDL", "LDLCALC", "TRIG", "CHOLHDL", "LDLDIRECT" in the last 72 hours. ------------------------------------------------------------------------------------------------------------------ No results for input(s): "TSH", "T4TOTAL", "T3FREE", "THYROIDAB" in the last 72 hours.  Invalid input(s): "FREET3" ------------------------------------------------------------------------------------------------------------------ No results for input(s): "VITAMINB12", "FOLATE", "FERRITIN", "TIBC", "IRON", "RETICCTPCT" in the last 72 hours.  Coagulation profile No results for input(s): "INR", "PROTIME" in the last 168 hours.  No results for input(s): "DDIMER" in the last 72 hours.  Cardiac Enzymes No results for input(s): "CKMB", "TROPONINI", "MYOGLOBIN" in the last 168 hours.  Invalid input(s): "CK" ------------------------------------------------------------------------------------------------------------------ Invalid input(s): "POCBNP"  1. Sickle cell disease without crisis (HCC)  - hydroxyurea (HYDREA) 500 MG capsule; Take 1 capsule (500 mg total) by mouth daily. May take with food to minimize GI side effects.  Dispense: 90 capsule; Refill: 1  2. Sickle cell anemia with pain (HCC)  - oxyCODONE-acetaminophen (PERCOCET/ROXICET) 5-325 MG tablet; Take 1 tablet by mouth every 8 (eight) hours as needed for moderate pain.  Dispense: 20 tablet; Refill: 0 - ibuprofen (ADVIL) 600 MG tablet; Take 1 tablet (600 mg total) by mouth every 8 (eight) hours as needed.  Dispense: 30 tablet; Refill: 0   3. Vitamin D deficiency  - Sickle Cell Panel  4. Depression, major, single episode, severe (HCC)    09/19/2022    8:44 AM 09/13/2022   11:27 AM  GAD 7 : Generalized Anxiety Score  Nervous, Anxious, on Edge 3   Control/stop worrying 3   Worry too much -  different things 3   Trouble relaxing 0   Restless 2   Easily annoyed or irritable 0   Afraid - awful might happen 0   Total GAD 7 Score 11   Anxiety Difficulty Somewhat difficult      Information is confidential and restricted. Go to Review Flowsheets to unlock data.     Time Spent in minutes  20   [place]  Rennis Petty  APRN, MSN, FNP-C Patient Care [facility] [address] [place], [place] [address] [phone]   Disclaimer: This note may have been dictated with voice recognition software. Similar sounding words can inadvertently be transcribed and this note may contain transcription errors which may not have been corrected upon publication of note.

## 2022-09-21 LAB — CMP14+CBC/D/PLT+FER+RETIC+V...
ALT: 13 IU/L (ref 0–44)
AST: 31 IU/L (ref 0–40)
Albumin/Globulin Ratio: 1.1 — ABNORMAL LOW (ref 1.2–2.2)
Albumin: 4 g/dL — ABNORMAL LOW (ref 4.3–5.2)
Alkaline Phosphatase: 58 IU/L (ref 44–121)
BUN/Creatinine Ratio: 6 — ABNORMAL LOW (ref 9–20)
BUN: 4 mg/dL — ABNORMAL LOW (ref 6–20)
Basophils Absolute: 0.1 10*3/uL (ref 0.0–0.2)
Basos: 1 %
Bilirubin Total: 2.6 mg/dL — ABNORMAL HIGH (ref 0.0–1.2)
CO2: 20 mmol/L (ref 20–29)
Calcium: 9.1 mg/dL (ref 8.7–10.2)
Chloride: 104 mmol/L (ref 96–106)
Creatinine, Ser: 0.65 mg/dL — ABNORMAL LOW (ref 0.76–1.27)
EOS (ABSOLUTE): 0.3 10*3/uL (ref 0.0–0.4)
Eos: 2 %
Ferritin: 180 ng/mL (ref 30–400)
Globulin, Total: 3.8 g/dL (ref 1.5–4.5)
Glucose: 85 mg/dL (ref 70–99)
Hematocrit: 28.9 % — ABNORMAL LOW (ref 37.5–51.0)
Hemoglobin: 9.4 g/dL — ABNORMAL LOW (ref 13.0–17.7)
Immature Grans (Abs): 0.1 10*3/uL (ref 0.0–0.1)
Immature Granulocytes: 1 %
Lymphocytes Absolute: 4.8 10*3/uL — ABNORMAL HIGH (ref 0.7–3.1)
Lymphs: 45 %
MCH: 32.2 pg (ref 26.6–33.0)
MCHC: 32.5 g/dL (ref 31.5–35.7)
MCV: 99 fL — ABNORMAL HIGH (ref 79–97)
Monocytes Absolute: 0.7 10*3/uL (ref 0.1–0.9)
Monocytes: 6 %
NRBC: 2 % — ABNORMAL HIGH (ref 0–0)
Neutrophils Absolute: 5 10*3/uL (ref 1.4–7.0)
Neutrophils: 45 %
Platelets: 527 10*3/uL — ABNORMAL HIGH (ref 150–450)
Potassium: 4.9 mmol/L (ref 3.5–5.2)
RBC: 2.92 x10E6/uL — ABNORMAL LOW (ref 4.14–5.80)
RDW: 23.1 % — ABNORMAL HIGH (ref 11.6–15.4)
Retic Ct Pct: 5.7 % — ABNORMAL HIGH (ref 0.6–2.6)
Sodium: 138 mmol/L (ref 134–144)
Total Protein: 7.8 g/dL (ref 6.0–8.5)
Vit D, 25-Hydroxy: 8.1 ng/mL — ABNORMAL LOW (ref 30.0–100.0)
WBC: 10.8 10*3/uL (ref 3.4–10.8)
eGFR: 136 mL/min/{1.73_m2} (ref >59)

## 2022-09-24 ENCOUNTER — Ambulatory Visit (HOSPITAL_COMMUNITY)
Admission: EM | Admit: 2022-09-24 | Discharge: 2022-09-24 | Disposition: A | Discharge status: Home / Self Care | Payer: No Payment, Other | Attending: Family | Admitting: Family

## 2022-09-24 DIAGNOSIS — Z59819 Housing instability, housed unspecified: Secondary | ICD-10-CM | POA: Insufficient documentation

## 2022-09-24 DIAGNOSIS — R45851 Suicidal ideations: Secondary | ICD-10-CM | POA: Insufficient documentation

## 2022-09-24 NOTE — Discharge Instructions (Addendum)

## 2022-09-24 NOTE — Progress Notes (Addendum)
William Gallegos: Emergent: is a 21 year old male who was brought in by the mom of his girlfriend voluntarily due to havig SI. He denied HI and AVH. Paitent was recently discharged from [facility] and as temporarily staying with his girlfriend.Patient stated that he got upset because his girlfriend put him out after they got into a confrontation and he told her that he was going  tke all his pills.  He has diagnosis of major depressive diosoreder and sickle cell anemia.  Patient appeared to be irritable.   09/24/22 1022  [facility] Triage Screening (Walk-ins at [facility] only)  How Did You Hear About [place]? Family/Friend  What Is the Reason for Your Visit/Call Today? Client is a 21 year old male who was brought in by the mom of his girlfired voluntarily due to havig SI. He denied HI and AVH. Paitent wazs recently discharged from [facility] and as temporarily staying with his girlfriend.Patient stated that he got upset because his girlfriend put him out after they got into a confrontation and he told her that he was going  tke all his pills.  He has diagnosis of major depressive diosoreder and sickle cell anemia.  How Long Has This Been Causing You Problems? <Week  Have You Recently Had Any Thoughts About Hurting Yourself? Yes  How long ago did you have thoughts about hurting yourself? Patient stated that he got upset because his girlfriend put him out after they got into a confrontation  Are You Planning to Commit Suicide/Harm Yourself At This time? No (Patient indicated he wanted to take his pills due to being homeless)  Have you Recently Had Thoughts About Hurting Someone Karolee Ohs? No  Are You Planning To Harm Someone At This Time? No  Are you currently experiencing any auditory, visual or other hallucinations? No  Have You Used Any Alcohol or Drugs in the Past 24 Hours? No (patient admitted to using marijuana on occassion)  Do you have any current medical co-morbidities that require immediate attention? No (patient has sickle cll  anemia)  Clinician description of patient physical appearance/behavior: Patient was  neat in appearenace, soft-spoken  What Do You Feel Would Help You the Most Today? Housing Assistance  If access to [facility] Urgent Care was not available, would you have sought care in the Emergency Department? Yes  Determination of Need Emergent (2 hours)  Options For Referral Medication Management;Outpatient Therapy

## 2022-09-24 NOTE — Progress Notes (Deleted)
   09/24/22 1022  [facility] Triage Screening (Walk-ins at [facility] only)  What Is the Reason for Your Visit/Call Today? Client is a 21 year old male who was brought in by the mom of his girlfired voluntarily due to havig SI. He denied HI and AVH.  How Long Has This Been Causing You Problems? <Week  Have You Recently Had Any Thoughts About Hurting Yourself? Yes  How long ago did you have thoughts about hurting yourself? Patient stated that he got upset because his girlfriend put him out after they got into a confrontation  Are You Planning to Commit Suicide/Harm Yourself At This time? No (Patient indicated he wanted to take his pills due to being homeless)  Have you Recently Had Thoughts About Hurting Someone Karolee Ohs? No  Are You Planning To Harm Someone At This Time? No  Are you currently experiencing any auditory, visual or other hallucinations? No  Have You Used Any Alcohol or Drugs in the Past 24 Hours? No (patient admitted to using marijuana on occassion)  Do you have any current medical co-morbidities that require immediate attention? No (patient has sickle cll anemia)  Clinician description of patient physical appearance/behavior: Patient was  neat in appearenace, soft-spoken  What Do You Feel Would Help You the Most Today? Housing Assistance  If access to [facility] Urgent Care was not available, would you have sought care in the Emergency Department? Yes  Determination of Need Emergent (2 hours)  Options For Referral Medication Management;Outpatient Therapy

## 2022-09-24 NOTE — ED Provider Notes (Signed)
Behavioral Health Urgent Care Medical Screening Exam  Patient Name: William Gallegos MRN: [id number] Date of Evaluation: 09/24/22 Chief Complaint:   Diagnosis:  Final diagnoses:  Suicide ideation  Housing instability    History of Present illness: William Gallegos is a 21 y.o. male.  Presents to [facility] urgent care with passive suicidal ideations due to being put out of his girlfriend's home.  States they got in a verbal altercation this morning and he had thoughts to overdose on medications.  Patient reports he had followed up with his sickle cell provider however has not followed up with therapy or psychiatry as of yet.  William Gallegos reports he was discharged initially to [facility].  He reports he has been seeking employment at this time however has not received any callbacks.  This provider offered to contact patient's mother for additional collateral however he states " my 57 year old brother lives at home and I can only stay until January that is not helping me."   Patient presents slightly irritable with family dynamic and housing situation.  He states "fine, I will just go to [facility] and check into a hospital there." NP spoke to patient's mother Wilber Bihari at [phone] who denied any safety concerns with patient as this relates to suicide attempt and/or plan. "  He was recently home 2 weeks prior however he continues to be disrespectful.  This is tough love. "  Patient's mother reports the patient offered an apology that he is more than welcome to come back home.   During evaluation William Gallegos is sitting in no acute distress. he is alert/oriented x 4; calm/cooperative; and mood congruent with affect. he is speaking in a clear tone at moderate volume, and normal pace; with good eye contact.  his thought process is coherent and relevant; There is no indication that he is currently responding to internal/external stimuli or experiencing delusional  thought content; and he has denied suicidal/self-harm/homicidal ideation, psychosis, and paranoia.   Patient has remained calm throughout assessment and has answered questions appropriately.    At this time William Gallegos is educated and verbalizes understanding of mental health resources and other crisis services in the community. he is instructed to call 911 and present to the nearest emergency room should he experience any suicidal/homicidal ideation, auditory/visual/hallucinations, or detrimental worsening of his mental health condition. he was a also advised by Clinical research associate that he could call the toll-free phone on back of  insurance card to assist with identifying in network counselors and agencies or number on back of Medicaid card to speak with care coordinator    Flowsheet Row Counselor from 09/15/2022 in [facility] Admission (Discharged) [facility] from 08/28/2022 in [facility]  C-SSRS RISK CATEGORY Low Risk High Risk High Risk       Psychiatric Specialty Exam  Presentation  General Appearance:Appropriate for Environment  Eye Contact:Fair  Speech:Clear and Coherent  Speech Volume:Decreased  Handedness:Right   Mood and Affect  Mood: Depressed  Affect: Congruent   Thought Process  Thought Processes: Coherent  Descriptions of Associations:Intact  Orientation:Full (Time, Place and Person)  Thought Content:Logical  Diagnosis of Schizophrenia or Schizoaffective disorder in past: No   Hallucinations:None  Ideas of Reference:None  Suicidal Thoughts:Yes, Passive Without Intent; Without Plan  Homicidal Thoughts:No Without Intent; Without Plan   Sensorium  Memory: Immediate Good; Recent Good; Remote Good  Judgment: Fair  Insight: Lacking   Executive  Functions  Concentration: Fair  Attention Span: Good  Recall: Good  Fund of  Knowledge: Fair  Language: Good   Psychomotor Activity  Psychomotor Activity: Normal   Assets  Assets: Desire for Improvement; Communication Skills; Resilience   Sleep  Sleep: Poor  Number of hours: No data recorded  No data recorded  Physical Exam: Physical Exam Vitals and nursing note reviewed.  Cardiovascular:     Rate and Rhythm: Normal rate and regular rhythm.  Pulmonary:     Effort: Pulmonary effort is normal.     Breath sounds: Normal breath sounds.  Neurological:     Mental Status: He is alert.  Psychiatric:        Mood and Affect: Mood normal.        Behavior: Behavior normal.    Review of Systems  Cardiovascular: Negative.   Neurological: Negative.   Psychiatric/Behavioral:  Positive for depression and suicidal ideas. The patient is nervous/anxious.   All other systems reviewed and are negative.  Blood pressure (!) 138/99, pulse 98, temperature 97.9 F (36.6 C), temperature source Oral, resp. rate 18, SpO2 97 %. There is no height or weight on file to calculate BMI.  Musculoskeletal: Strength & Muscle Tone: within normal limits Gait & Station: normal Patient leans: N/A   [place] [facility] Discharge Disposition for Follow up and Recommendations: Based on my evaluation the patient does not appear to have an emergency medical condition and can be discharged with resources and follow up care in outpatient services for Medication Management, Individual Therapy, and Group Therapy   Derrill [place], NP 09/24/2022, 10:29 AM

## 2022-09-24 NOTE — ED Notes (Signed)
Patient was discharged by provider. Patient was given AVS with community resources.  

## 2022-10-01 ENCOUNTER — Other Ambulatory Visit: Payer: Self-pay | Admitting: Family Medicine

## 2022-10-01 DIAGNOSIS — E559 Vitamin D deficiency, unspecified: Secondary | ICD-10-CM

## 2022-10-01 MED ORDER — ERGOCALCIFEROL 1.25 MG (50000 UT) PO CAPS: 50000.0000 [IU] | ORAL_CAPSULE | ORAL | 1 refills | Status: AC

## 2022-10-01 NOTE — Progress Notes (Signed)
Meds ordered this encounter  Medications   ergocalciferol (VITAMIN D2) 1.25 MG (50000 UT) capsule    Sig: Take 1 capsule (50,000 Units total) by mouth once a week.    Dispense:  12 capsule    Refill:  1    Order Specific Question:   Supervising Provider    Answer:   JEGEDE, OLUGBEMIGA E [[id number]]   Alyn Jurney Moore Mikaylah Libbey  APRN, MSN, FNP-C [facility] [address]  [place], [place] [address] [phone]  

## 2022-10-06 ENCOUNTER — Encounter (HOSPITAL_COMMUNITY)
Payer: Federal, State, Local not specified - Other | Admitting: Student in an Organized Health Care Education/Training Program

## 2022-10-17 ENCOUNTER — Telehealth (HOSPITAL_COMMUNITY): Payer: Self-pay | Admitting: Mental Health

## 2022-10-17 ENCOUNTER — Ambulatory Visit (HOSPITAL_COMMUNITY): Payer: Federal, State, Local not specified - Other | Admitting: Mental Health

## 2022-10-17 ENCOUNTER — Other Ambulatory Visit: Payer: Self-pay

## 2022-10-18 ENCOUNTER — Other Ambulatory Visit: Payer: Self-pay

## 2022-10-20 ENCOUNTER — Other Ambulatory Visit: Payer: Self-pay

## 2022-11-04 ENCOUNTER — Emergency Department (HOSPITAL_COMMUNITY): Payer: Medicaid Other

## 2022-11-04 ENCOUNTER — Emergency Department (HOSPITAL_COMMUNITY)
Admission: EM | Admit: 2022-11-04 | Discharge: 2022-11-04 | Disposition: A | Discharge status: Home / Self Care | Payer: Medicaid Other | Attending: Emergency Medicine | Admitting: Emergency Medicine

## 2022-11-04 ENCOUNTER — Encounter (HOSPITAL_COMMUNITY): Payer: Self-pay | Admitting: Emergency Medicine

## 2022-11-04 ENCOUNTER — Other Ambulatory Visit: Payer: Self-pay

## 2022-11-04 DIAGNOSIS — M79671 Pain in right foot: Secondary | ICD-10-CM | POA: Diagnosis present

## 2022-11-04 DIAGNOSIS — X500XXA Overexertion from strenuous movement or load, initial encounter: Secondary | ICD-10-CM | POA: Insufficient documentation

## 2022-11-04 DIAGNOSIS — S92001A Unspecified fracture of right calcaneus, initial encounter for closed fracture: Secondary | ICD-10-CM | POA: Insufficient documentation

## 2022-11-04 MED ORDER — IBUPROFEN 200 MG PO TABS
400.0000 mg | ORAL_TABLET | Freq: Once | ORAL | Status: AC | PRN
  Administered 2022-11-04: 400 mg via ORAL
  Filled 2022-11-04: qty 2

## 2022-11-04 MED ORDER — SODIUM CHLORIDE 0.9 % IV BOLUS
1000.0000 mL | Freq: Once | INTRAVENOUS | Status: AC
  Administered 2022-11-04: 1000 mL via INTRAVENOUS

## 2022-11-04 MED ORDER — HYDROCODONE-ACETAMINOPHEN 5-325 MG PO TABS
1.0000 | ORAL_TABLET | Freq: Once | ORAL | Status: AC
  Administered 2022-11-04: 1 via ORAL
  Filled 2022-11-04: qty 1

## 2022-11-04 MED ORDER — FENTANYL CITRATE PF 50 MCG/ML IJ SOSY
50.0000 ug | PREFILLED_SYRINGE | Freq: Once | INTRAMUSCULAR | Status: AC
  Administered 2022-11-04: 50 ug via INTRAVENOUS
  Filled 2022-11-04: qty 1

## 2022-11-04 MED ORDER — KETOROLAC TROMETHAMINE 30 MG/ML IJ SOLN
30.0000 mg | Freq: Once | INTRAMUSCULAR | Status: AC
  Administered 2022-11-04: 30 mg via INTRAVENOUS
  Filled 2022-11-04: qty 1

## 2022-11-04 MED ORDER — ASPIRIN 325 MG PO TBEC: 325.0000 mg | DELAYED_RELEASE_TABLET | Freq: Every day | ORAL | 0 refills | Status: AC

## 2022-11-04 MED ORDER — OXYCODONE HCL 5 MG PO TABS: 2.5000 mg | ORAL_TABLET | ORAL | 0 refills | Status: AC | PRN

## 2022-11-04 MED ORDER — ONDANSETRON HCL 4 MG/2ML IJ SOLN
4.0000 mg | Freq: Once | INTRAMUSCULAR | Status: AC
  Administered 2022-11-04: 4 mg via INTRAVENOUS
  Filled 2022-11-04: qty 2

## 2022-11-04 NOTE — Progress Notes (Signed)
Orthopedic Tech Progress Note Patient Details:  William Gallegos January 02, 2001 [id number]  Received call from Margarita Mail, PA-C communicating that Dr. Stann Mainland would like a more heavily padded splint. Initial splint was padded, but not in excess like a bulky jones splint. Splint was removed and replaced with a very generous amount of padding and a new posterior short leg splint. Sensation and movement of digits remained intact. Encouraged ice and elevation to help with any pain/swelling. Pt is aware of importance of remaining NWB to his RLE and understands he will be needing to follow up with orthopedics for further treatment. I also provided the pt with ice packs and an apple juice. All needs met at this time and his call bell is within reach.  Ortho Devices Type of Ortho Device: Post (short leg) splint (bulky jones) Ortho Device/Splint Location: RLE Ortho Device/Splint Interventions: Ordered, Application, Adjustment, Removal (removal of first short leg splint)   Post Interventions Patient Tolerated: Well Instructions Provided: Care of device, Adjustment of device  [name] Jeri Modena 11/04/2022, 10:31 AM

## 2022-11-04 NOTE — Discharge Instructions (Addendum)
You must remain nonweightbearing on your foot.  Use your crutches at all time. Make sure that you are keeping your leg elevated above your heart.  You may get up to do activities of daily living but make sure you are also spending time resting and elevating your leg in order to decrease swelling in her foot.  Not sleep with your leg hanging down like in recliner as this can lead to severe swelling in your foot.  Make sure you are also icing your foot.  Follow-up in about 1 week with Dr. Kathaleen Bury. Get help right away for any of the symptoms we talked about at bedside including pain out of proportion to your injury, numbness tingling or blue tent to your toes, severe pain swelling or firmness of your calf.

## 2022-11-04 NOTE — Progress Notes (Signed)
Orthopedic Tech Progress Note Patient Details:  William Gallegos 2000-10-07 [id number]  Ortho Devices Type of Ortho Device: Crutches, Short leg splint Ortho Device/Splint Location: RLE Ortho Device/Splint Interventions: Ordered, Application, Adjustment   Post Interventions Patient Tolerated: Well Instructions Provided: Adjustment of device, Care of device, Poper ambulation with device  Helena Sardo L Jennefer Kopp 11/04/2022, 7:07 AM

## 2022-11-04 NOTE — ED Provider Notes (Signed)
   7:17 AM Patient refused x-ray.  I discussed that it is necessary to obtain this to make sure he does not have a back fracture in the setting of the type of foot fracture he has.  He is agreeable.  He would like more pain medication.  On my exam currently he appears very dry and is asking for something to drink.  Will obtain IV and give fluids, pain meds.  Plan on consulting with Ortho. Physical Exam  BP 134/83   Pulse 95   Temp 99 F (37.2 C) (Oral)   Resp 18   Ht 5\' 9"  (1.753 m)   Wt 72.6 kg   SpO2 95%   BMI 23.63 kg/m   Physical Exam  Procedures  Procedures  ED Course / MDM    Medical Decision Making Amount and/or Complexity of Data Reviewed Radiology: ordered.  Risk OTC drugs. Prescription drug management.   Case discussed with Dr. Stann Mainland.  Placed a splint with a bulky posterior splint.  He recommends he follow-up with Armond Hang at [facility]. Patient pain controlled.  Will also place him on full dose aspirin given his history of sickle cell anemia.  Patient presented was appropriate for discharge at this time with close outpatient follow-up, nonweightbearing on the leg.  Discussed return precautions.       Margarita Mail, PA-C 11/04/22 1529    Godfrey Pick, MD 11/06/22 [phone]

## 2022-11-04 NOTE — ED Notes (Signed)
Patient transported to CT 
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
## 2022-11-04 NOTE — ED Notes (Signed)
Called ortho tech for splint, has a few patients to see at [facility] and another here then will be here
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
## 2022-11-07 ENCOUNTER — Telehealth: Payer: Self-pay

## 2022-11-07 NOTE — Telephone Encounter (Signed)
TOC call was not made due to this being a sickle cell pt .   Elyse Jarvis RMA

## 2022-11-14 ENCOUNTER — Encounter (HOSPITAL_COMMUNITY): Payer: Self-pay

## 2022-11-14 ENCOUNTER — Ambulatory Visit (HOSPITAL_COMMUNITY): Payer: No Payment, Other | Admitting: Mental Health

## 2022-12-13 ENCOUNTER — Encounter (HOSPITAL_COMMUNITY): Payer: No Payment, Other | Admitting: Student in an Organized Health Care Education/Training Program

## 2022-12-19 ENCOUNTER — Ambulatory Visit: Payer: Self-pay | Admitting: Family Medicine

## 2023-06-08 DIAGNOSIS — Z87891 Personal history of nicotine dependence: Secondary | ICD-10-CM | POA: Diagnosis not present

## 2023-06-08 DIAGNOSIS — I10 Essential (primary) hypertension: Secondary | ICD-10-CM | POA: Diagnosis not present

## 2023-06-08 DIAGNOSIS — D571 Sickle-cell disease without crisis: Secondary | ICD-10-CM | POA: Diagnosis not present

## 2023-06-08 DIAGNOSIS — Z881 Allergy status to other antibiotic agents status: Secondary | ICD-10-CM | POA: Diagnosis not present

## 2024-05-30 DIAGNOSIS — D57219 Sickle-cell/Hb-C disease with crisis, unspecified: Secondary | ICD-10-CM | POA: Diagnosis not present

## 2024-05-30 DIAGNOSIS — R17 Unspecified jaundice: Secondary | ICD-10-CM | POA: Diagnosis not present

## 2024-05-30 DIAGNOSIS — R7989 Other specified abnormal findings of blood chemistry: Secondary | ICD-10-CM | POA: Diagnosis not present

## 2024-05-30 DIAGNOSIS — D72829 Elevated white blood cell count, unspecified: Secondary | ICD-10-CM | POA: Diagnosis not present

## 2024-05-30 DIAGNOSIS — M79604 Pain in right leg: Secondary | ICD-10-CM | POA: Diagnosis not present

## 2024-05-30 DIAGNOSIS — D5700 Hb-SS disease with crisis, unspecified: Secondary | ICD-10-CM | POA: Diagnosis not present

## 2024-05-30 DIAGNOSIS — Z9049 Acquired absence of other specified parts of digestive tract: Secondary | ICD-10-CM | POA: Diagnosis not present

## 2024-05-30 DIAGNOSIS — D571 Sickle-cell disease without crisis: Secondary | ICD-10-CM | POA: Diagnosis not present

## 2024-05-30 DIAGNOSIS — F1020 Alcohol dependence, uncomplicated: Secondary | ICD-10-CM | POA: Diagnosis not present

## 2024-05-31 DIAGNOSIS — R17 Unspecified jaundice: Secondary | ICD-10-CM | POA: Diagnosis not present

## 2024-05-31 DIAGNOSIS — F1020 Alcohol dependence, uncomplicated: Secondary | ICD-10-CM | POA: Diagnosis not present

## 2024-05-31 DIAGNOSIS — D72829 Elevated white blood cell count, unspecified: Secondary | ICD-10-CM | POA: Diagnosis not present

## 2024-05-31 DIAGNOSIS — D5700 Hb-SS disease with crisis, unspecified: Secondary | ICD-10-CM | POA: Diagnosis not present
